# Patient Record
Sex: Female | Born: 1960 | Race: White | Hispanic: No | Marital: Married | Smoking: Never smoker
Health system: Southern US, Community
[De-identification: ages and names within clinical notes are randomized; demographics above are authoritative.]

## PROBLEM LIST (undated history)

## (undated) DIAGNOSIS — T7840XA Allergy, unspecified, initial encounter: Secondary | ICD-10-CM

## (undated) DIAGNOSIS — R42 Dizziness and giddiness: Secondary | ICD-10-CM

## (undated) DIAGNOSIS — B019 Varicella without complication: Secondary | ICD-10-CM

## (undated) DIAGNOSIS — E559 Vitamin D deficiency, unspecified: Secondary | ICD-10-CM

## (undated) DIAGNOSIS — N841 Polyp of cervix uteri: Secondary | ICD-10-CM

## (undated) DIAGNOSIS — G43909 Migraine, unspecified, not intractable, without status migrainosus: Secondary | ICD-10-CM

## (undated) DIAGNOSIS — L821 Other seborrheic keratosis: Secondary | ICD-10-CM

## (undated) DIAGNOSIS — N6009 Solitary cyst of unspecified breast: Secondary | ICD-10-CM

## (undated) HISTORY — DX: Other seborrheic keratosis: L82.1

## (undated) HISTORY — DX: Varicella without complication: B01.9

## (undated) HISTORY — DX: Migraine, unspecified, not intractable, without status migrainosus: G43.909

## (undated) HISTORY — DX: Vitamin D deficiency, unspecified: E55.9

## (undated) HISTORY — PX: ABDOMINAL EXPLORATION SURGERY: SHX538

## (undated) HISTORY — DX: Solitary cyst of unspecified breast: N60.09

## (undated) HISTORY — DX: Allergy, unspecified, initial encounter: T78.40XA

## (undated) HISTORY — DX: Dizziness and giddiness: R42

## (undated) HISTORY — PX: MOHS SURGERY: SHX181

## (undated) HISTORY — DX: Polyp of cervix uteri: N84.1

---

## 2001-12-13 ENCOUNTER — Other Ambulatory Visit: Admission: RE | Admit: 2001-12-13 | Discharge: 2001-12-13 | Payer: Self-pay

## 2003-03-05 ENCOUNTER — Other Ambulatory Visit: Admission: RE | Admit: 2003-03-05 | Discharge: 2003-03-05 | Payer: Self-pay | Admitting: *Deleted

## 2003-05-12 ENCOUNTER — Encounter: Admission: RE | Admit: 2003-05-12 | Discharge: 2003-05-12 | Payer: Self-pay | Admitting: *Deleted

## 2003-05-12 ENCOUNTER — Encounter: Payer: Self-pay | Admitting: *Deleted

## 2004-05-13 ENCOUNTER — Encounter: Admission: RE | Admit: 2004-05-13 | Discharge: 2004-05-13 | Payer: Self-pay | Admitting: *Deleted

## 2004-06-03 ENCOUNTER — Other Ambulatory Visit: Admission: RE | Admit: 2004-06-03 | Discharge: 2004-06-03 | Payer: Self-pay | Admitting: *Deleted

## 2005-06-17 ENCOUNTER — Other Ambulatory Visit: Admission: RE | Admit: 2005-06-17 | Discharge: 2005-06-17 | Payer: Self-pay | Admitting: *Deleted

## 2005-06-20 ENCOUNTER — Encounter: Admission: RE | Admit: 2005-06-20 | Discharge: 2005-06-20 | Payer: Self-pay | Admitting: *Deleted

## 2005-06-30 ENCOUNTER — Encounter: Admission: RE | Admit: 2005-06-30 | Discharge: 2005-06-30 | Payer: Self-pay | Admitting: *Deleted

## 2006-06-16 ENCOUNTER — Other Ambulatory Visit: Admission: RE | Admit: 2006-06-16 | Discharge: 2006-06-16 | Payer: Self-pay | Admitting: Obstetrics & Gynecology

## 2006-06-23 ENCOUNTER — Encounter: Admission: RE | Admit: 2006-06-23 | Discharge: 2006-06-23 | Payer: Self-pay | Admitting: Obstetrics & Gynecology

## 2006-12-15 ENCOUNTER — Encounter: Admission: RE | Admit: 2006-12-15 | Discharge: 2006-12-15 | Payer: Self-pay | Admitting: Obstetrics & Gynecology

## 2007-07-03 ENCOUNTER — Encounter: Admission: RE | Admit: 2007-07-03 | Discharge: 2007-07-03 | Payer: Self-pay | Admitting: Obstetrics & Gynecology

## 2007-08-16 ENCOUNTER — Other Ambulatory Visit: Admission: RE | Admit: 2007-08-16 | Discharge: 2007-08-16 | Payer: Self-pay | Admitting: Obstetrics & Gynecology

## 2008-07-22 ENCOUNTER — Encounter: Admission: RE | Admit: 2008-07-22 | Discharge: 2008-07-22 | Payer: Self-pay | Admitting: Obstetrics & Gynecology

## 2008-08-21 ENCOUNTER — Other Ambulatory Visit: Admission: RE | Admit: 2008-08-21 | Discharge: 2008-08-21 | Payer: Self-pay | Admitting: Obstetrics & Gynecology

## 2009-07-31 ENCOUNTER — Encounter: Admission: RE | Admit: 2009-07-31 | Discharge: 2009-07-31 | Payer: Self-pay | Admitting: Obstetrics & Gynecology

## 2010-08-13 ENCOUNTER — Encounter: Admission: RE | Admit: 2010-08-13 | Discharge: 2010-08-13 | Payer: Self-pay | Admitting: Obstetrics & Gynecology

## 2010-10-31 ENCOUNTER — Encounter: Payer: Self-pay | Admitting: *Deleted

## 2011-07-08 ENCOUNTER — Other Ambulatory Visit: Payer: Self-pay | Admitting: Obstetrics & Gynecology

## 2011-07-08 DIAGNOSIS — Z1231 Encounter for screening mammogram for malignant neoplasm of breast: Secondary | ICD-10-CM

## 2011-08-26 ENCOUNTER — Ambulatory Visit
Admission: RE | Admit: 2011-08-26 | Discharge: 2011-08-26 | Disposition: A | Discharge status: Home / Self Care | Payer: Managed Care, Other (non HMO) | Source: Ambulatory Visit | Attending: Obstetrics & Gynecology | Admitting: Obstetrics & Gynecology

## 2011-08-26 DIAGNOSIS — Z1231 Encounter for screening mammogram for malignant neoplasm of breast: Secondary | ICD-10-CM

## 2011-09-05 ENCOUNTER — Other Ambulatory Visit: Payer: Self-pay | Admitting: Obstetrics & Gynecology

## 2011-09-05 DIAGNOSIS — R928 Other abnormal and inconclusive findings on diagnostic imaging of breast: Secondary | ICD-10-CM

## 2011-09-09 ENCOUNTER — Ambulatory Visit
Admission: RE | Admit: 2011-09-09 | Discharge: 2011-09-09 | Disposition: A | Discharge status: Home / Self Care | Payer: Managed Care, Other (non HMO) | Source: Ambulatory Visit | Attending: Obstetrics & Gynecology | Admitting: Obstetrics & Gynecology

## 2011-09-09 DIAGNOSIS — R928 Other abnormal and inconclusive findings on diagnostic imaging of breast: Secondary | ICD-10-CM

## 2012-04-26 ENCOUNTER — Other Ambulatory Visit: Payer: Self-pay | Admitting: Obstetrics & Gynecology

## 2012-04-26 DIAGNOSIS — N6009 Solitary cyst of unspecified breast: Secondary | ICD-10-CM

## 2012-05-02 ENCOUNTER — Ambulatory Visit
Admission: RE | Admit: 2012-05-02 | Discharge: 2012-05-02 | Disposition: A | Discharge status: Home / Self Care | Payer: Managed Care, Other (non HMO) | Source: Ambulatory Visit | Attending: Obstetrics & Gynecology | Admitting: Obstetrics & Gynecology

## 2012-05-02 ENCOUNTER — Other Ambulatory Visit: Payer: Self-pay | Admitting: Obstetrics & Gynecology

## 2012-05-02 DIAGNOSIS — N6009 Solitary cyst of unspecified breast: Secondary | ICD-10-CM

## 2012-05-02 IMAGING — US US BREAST*L*
1 series · 5 of 5 positions shown · non-contrast
Comparison: [DATE], [DATE] and [DATE] as well as
[DATE]

CLINICAL DATA: Patient presents for a 6-month follow-up diagnostic
left breast mammogram and ultrasound to evaluatea previously seen
probable cyst.

DIGITAL DIAGNOSTIC LEFT MAMMOGRAM WITH CAD AND LEFT BREAST
ULTRASOUND:

[Series 1: us breast*left* · 5 of 5 slices shown]
[im 1/5]
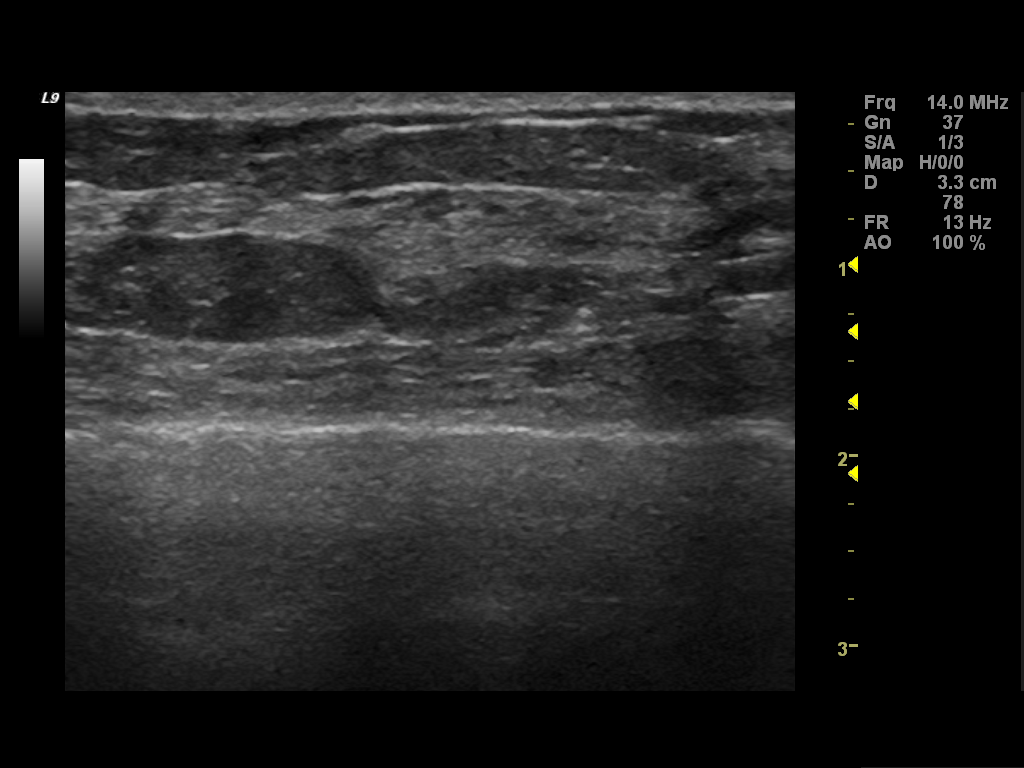
[im 2/5]
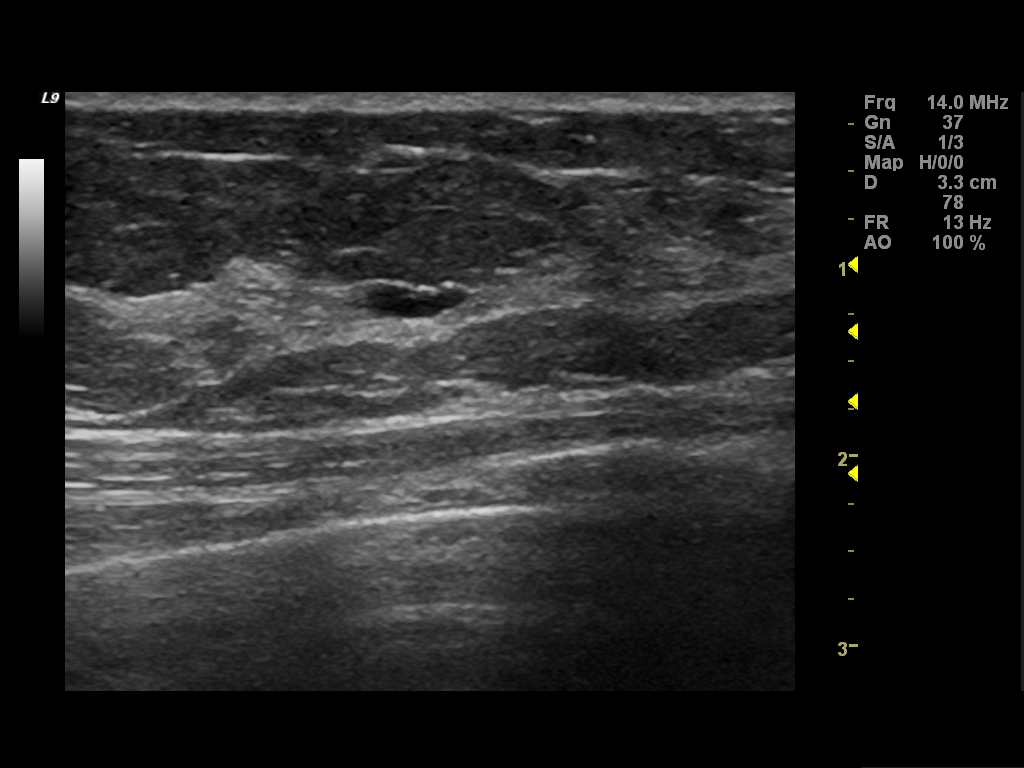
[im 3/5]
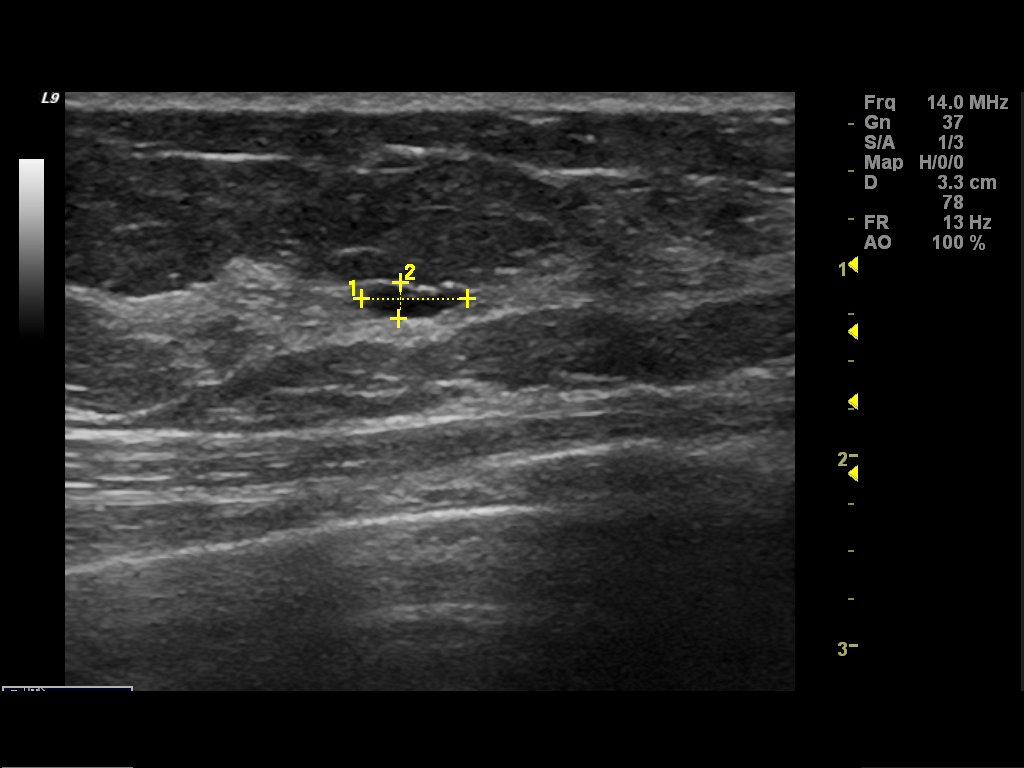
[im 4/5]
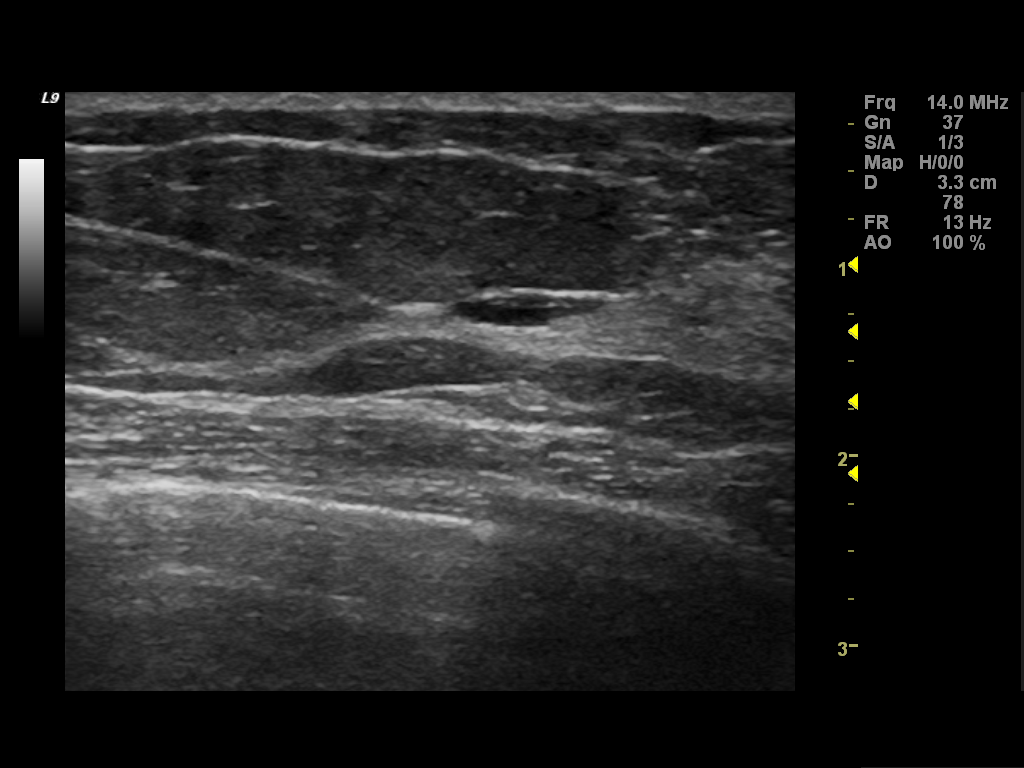
[im 5/5]
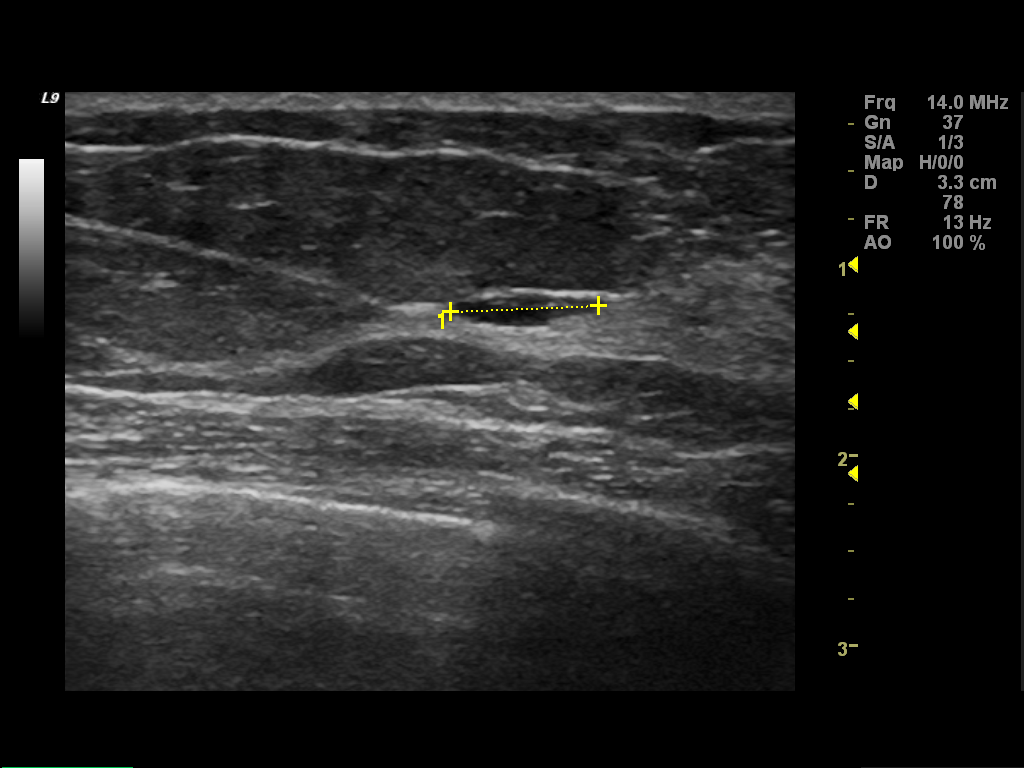

[5 of 5 positions shown; findings below may reference images not displayed]

FINDINGS: Exam demonstrates heterogeneously dense fibroglandular
tissue.  There is  no change in a small nodular density of the
inner left breast.  Remainder of the exam is unchanged.
Mammographic images were processed with CAD.

Ultrasound is performed, showing resolution of the previously noted
5 mm probable cyst at the 6 o'clock position 2 cm from the nipple.
Ultrasound evaluation of the inner half of the left breast
demonstrates a well defined ovoid hypoechoic nodule measuring 2 x 6
x 8 mm likely a benign process such as a complicated cyst. This is
located at the 10 o'clock position 2 cm from the nipple.
IMPRESSION: Resolution of the previously seen 5 mm probable cyst at the 6
o'clock position.  2 x 6 x 8 mm probable  complicated cyst at the
10 o'clock position 2 cm from the nipple.

RECOMMENDATION:
Recommend an additional 6-month follow-up diagnostic left breast
mammogram and ultrasound.

BI-RADS CATEGORY 3:  Probably benign finding(s) - short interval
follow-up suggested.

## 2012-10-10 DIAGNOSIS — R42 Dizziness and giddiness: Secondary | ICD-10-CM

## 2012-10-10 HISTORY — DX: Dizziness and giddiness: R42

## 2012-10-18 ENCOUNTER — Other Ambulatory Visit: Payer: Self-pay | Admitting: Obstetrics & Gynecology

## 2012-10-18 DIAGNOSIS — N6009 Solitary cyst of unspecified breast: Secondary | ICD-10-CM

## 2012-10-31 ENCOUNTER — Emergency Department (HOSPITAL_BASED_OUTPATIENT_CLINIC_OR_DEPARTMENT_OTHER)
Admission: EM | Admit: 2012-10-31 | Discharge: 2012-10-31 | Disposition: A | Discharge status: Home / Self Care | Payer: Managed Care, Other (non HMO) | Attending: Emergency Medicine | Admitting: Emergency Medicine

## 2012-10-31 ENCOUNTER — Encounter (HOSPITAL_BASED_OUTPATIENT_CLINIC_OR_DEPARTMENT_OTHER): Payer: Self-pay | Admitting: Family Medicine

## 2012-10-31 DIAGNOSIS — R42 Dizziness and giddiness: Secondary | ICD-10-CM

## 2012-10-31 DIAGNOSIS — R110 Nausea: Secondary | ICD-10-CM | POA: Insufficient documentation

## 2012-10-31 LAB — COMPREHENSIVE METABOLIC PANEL
Albumin: 4 g/dL (ref 3.5–5.2)
Alkaline Phosphatase: 80 U/L (ref 39–117)
BUN: 18 mg/dL (ref 6–23)
CO2: 23 mEq/L (ref 19–32)
Chloride: 103 mEq/L (ref 96–112)
Creatinine, Ser: 0.6 mg/dL (ref 0.50–1.10)
GFR calc Af Amer: >90 mL/min (ref >90)
GFR calc non Af Amer: >90 mL/min (ref >90)
Glucose, Bld: 112 mg/dL — ABNORMAL HIGH (ref 70–99)
Total Bilirubin: 0.3 mg/dL (ref 0.3–1.2)

## 2012-10-31 LAB — URINALYSIS, ROUTINE W REFLEX MICROSCOPIC
Ketones, ur: NEGATIVE mg/dL
Leukocytes, UA: NEGATIVE
Protein, ur: NEGATIVE mg/dL
Urobilinogen, UA: 0.2 mg/dL (ref 0.0–1.0)

## 2012-10-31 LAB — CBC WITH DIFFERENTIAL/PLATELET
Basophils Absolute: 0 10*3/uL (ref 0.0–0.1)
Basophils Relative: 0 % (ref 0–1)
HCT: 42.4 % (ref 36.0–46.0)
Hemoglobin: 14.6 g/dL (ref 12.0–15.0)
Lymphs Abs: 1.1 10*3/uL (ref 0.7–4.0)
MCV: 88.9 fL (ref 78.0–100.0)
Monocytes Relative: 6 % (ref 3–12)
Neutro Abs: 12.2 10*3/uL — ABNORMAL HIGH (ref 1.7–7.7)
RDW: 13.4 % (ref 11.5–15.5)
WBC: 14.1 10*3/uL — ABNORMAL HIGH (ref 4.0–10.5)

## 2012-10-31 MED ORDER — MECLIZINE HCL 25 MG PO TABS
25.0000 mg | ORAL_TABLET | Freq: Once | ORAL | Status: AC
  Administered 2012-10-31: 25 mg via ORAL
  Filled 2012-10-31: qty 1

## 2012-10-31 MED ORDER — ONDANSETRON 8 MG PO TBDP: 8.0000 mg | ORAL_TABLET | Freq: Once | ORAL | Status: DC

## 2012-10-31 MED ORDER — SODIUM CHLORIDE 0.9 % IV SOLN
INTRAVENOUS | Status: DC
  Administered 2012-10-31: 13:00:00 via INTRAVENOUS

## 2012-10-31 MED ORDER — MECLIZINE HCL 25 MG PO TABS: 25.0000 mg | ORAL_TABLET | Freq: Four times a day (QID) | ORAL | Status: DC

## 2012-10-31 MED ORDER — ONDANSETRON 8 MG PO TBDP
ORAL_TABLET | ORAL | Status: AC
  Administered 2012-10-31: 8 mg
  Filled 2012-10-31: qty 1

## 2012-10-31 NOTE — ED Notes (Signed)
Pt c/o dizziness and nausea x 90 mins.

## 2012-10-31 NOTE — ED Provider Notes (Signed)
History     CSN: [id number]  Arrival date & time 10/31/12  1043   First MD Initiated Contact with Patient 10/31/12 1207      Chief Complaint  Patient presents with  . Dizziness  . Nausea    (Consider location/radiation/quality/duration/timing/severity/associated sxs/prior treatment) HPI Comments: The patient is a 52 year old woman who woke up this morning around 10 AM and felt hot. She says that the room was spinning. Then she had nausea. Her balance is off. She therefore sought evaluation.  Patient is a 52 y.o. female presenting with neurologic complaint. The history is provided by the patient and the spouse. No language interpreter was used.  Neurologic Problem The primary symptoms include dizziness. The symptoms began 1 to 2 hours ago. Episode duration: The feeling of dizziness, spinning is constant. The symptoms are unchanged. Context: No apparent precipitating event.  She describes the dizziness as a sensation of spinning. The dizziness began today. The dizziness has been unchanged since its onset. It is a new problem.  Additional symptoms include loss of balance and nystagmus.    History reviewed. No pertinent past medical history.  History reviewed. No pertinent past surgical history.  No family history on file.  History  Substance Use Topics  . Smoking status: Never Smoker   . Smokeless tobacco: Not on file  . Alcohol Use: Yes    OB History    Grav Para Term Preterm Abortions TAB SAB Ect Mult Living                  Review of Systems  Constitutional: Negative.   HENT: Negative.   Eyes: Negative.   Respiratory: Negative.   Cardiovascular: Negative.   Gastrointestinal: Negative.   Genitourinary: Negative.   Musculoskeletal: Negative.   Skin: Negative.   Neurological: Positive for dizziness and loss of balance.  Psychiatric/Behavioral: Negative.     Allergies  Review of patient's allergies indicates no known allergies.  Home Medications  No current  outpatient prescriptions on file.  BP 108/58  Pulse 60  Temp 97.7 F (36.5 C) (Oral)  Resp 18  SpO2 100%  Physical Exam  Nursing note and vitals reviewed. Constitutional: She is oriented to person, place, and time. She appears well-developed and well-nourished.       In moderate to severe distress with dizziness.  HENT:  Head: Normocephalic and atraumatic.  Right Ear: External ear normal.  Left Ear: External ear normal.  Mouth/Throat: Oropharynx is clear and moist.  Eyes: Conjunctivae normal and EOM are normal. Pupils are equal, round, and reactive to light.       Nystagmus is present to lateral gaze to either side, more to the right.  Neck: Normal range of motion. Neck supple.  Cardiovascular: Normal rate, regular rhythm and normal heart sounds.   Pulmonary/Chest: Effort normal and breath sounds normal.  Abdominal: Soft. Bowel sounds are normal.  Neurological: She is alert and oriented to person, place, and time.       No sensory or motor deficit.  Skin: Skin is warm and dry.  Psychiatric: She has a normal mood and affect. Her behavior is normal.    ED Course  Procedures (including critical care time)  12:28 PM Pt was seen --> physical exam performed.  Antivert 25 mg po ordered.  12:50 PM Pt vomited up the Antivert.  Zofran ODT 8 mg ordered.  IV fluids, lab workup ordered.  1:26 PM  Date: 10/31/2012  Rate: 65  Rhythm: normal sinus rhythm  QRS Axis:  normal  Intervals: normal QRS:  Poor R wave progression in precordial leads suggests old anterior myocardial infarction.  ST/T Wave abnormalities: normal  Conduction Disutrbances:none  Narrative Interpretation: Abnormal EKG.  Old EKG Reviewed: none available  1:48 PM Continues with spinning feeling.  Will retry Antivert 25 mg po.  Waiting for lab results.  2:40 PM Results for orders placed during the hospital encounter of 10/31/12  CBC WITH DIFFERENTIAL      Component Value Range   WBC 14.1 (*) 4.0 - 10.5 K/uL    RBC 4.77  3.87 - 5.11 MIL/uL   Hemoglobin 14.6  12.0 - 15.0 g/dL   HCT 40.9  81.1 - 91.4 %   MCV 88.9  78.0 - 100.0 fL   MCH 30.6  26.0 - 34.0 pg   MCHC 34.4  30.0 - 36.0 g/dL   RDW 78.2  95.6 - 21.3 %   Platelets    150 - 400 K/uL   Value: PLATELET CLUMPS NOTED ON SMEAR, COUNT APPEARS DECREASED   Neutrophils Relative 86 (*) 43 - 77 %   Lymphocytes Relative 8 (*) 12 - 46 %   Monocytes Relative 6  3 - 12 %   Eosinophils Relative 0  0 - 5 %   Basophils Relative 0  0 - 1 %   Neutro Abs 12.2 (*) 1.7 - 7.7 K/uL   Lymphs Abs 1.1  0.7 - 4.0 K/uL   Monocytes Absolute 0.8  0.1 - 1.0 K/uL   Eosinophils Absolute 0.0  0.0 - 0.7 K/uL   Basophils Absolute 0.0  0.0 - 0.1 K/uL   Smear Review       Value: PLATELET CLUMPS NOTED ON SMEAR, COUNT APPEARS DECREASED  COMPREHENSIVE METABOLIC PANEL      Component Value Range   Sodium 136  135 - 145 mEq/L   Potassium 4.0  3.5 - 5.1 mEq/L   Chloride 103  96 - 112 mEq/L   CO2 23  19 - 32 mEq/L   Glucose, Bld 112 (*) 70 - 99 mg/dL   BUN 18  6 - 23 mg/dL   Creatinine, Ser 0.86  0.50 - 1.10 mg/dL   Calcium 9.4  8.4 - 57.8 mg/dL   Total Protein 7.1  6.0 - 8.3 g/dL   Albumin 4.0  3.5 - 5.2 g/dL   AST 26  0 - 37 U/L   ALT 20  0 - 35 U/L   Alkaline Phosphatase 80  39 - 117 U/L   Total Bilirubin 0.3  0.3 - 1.2 mg/dL   GFR calc non Af Amer >90  >90 mL/min   GFR calc Af Amer >90  >90 mL/min  URINALYSIS, ROUTINE W REFLEX MICROSCOPIC      Component Value Range   Color, Urine YELLOW  YELLOW   APPearance CLOUDY (*) CLEAR   Specific Gravity, Urine 1.024  1.005 - 1.030   pH 5.0  5.0 - 8.0   Glucose, UA NEGATIVE  NEGATIVE mg/dL   Hgb urine dipstick NEGATIVE  NEGATIVE   Bilirubin Urine NEGATIVE  NEGATIVE   Ketones, ur NEGATIVE  NEGATIVE mg/dL   Protein, ur NEGATIVE  NEGATIVE mg/dL   Urobilinogen, UA 0.2  0.0 - 1.0 mg/dL   Nitrite NEGATIVE  NEGATIVE   Leukocytes, UA NEGATIVE  NEGATIVE   2:41 PM I reviewed pt's lab results with her and with her  husband.  Dizziness is better, not vomiting.  Wishes to treat this at home.  Rx Antivert 25 mg qid  x 1 week.  Clear liquids today.  No work for one week.    1. Vertigo           Carleene Cooper III, MD 10/31/12 [phone]

## 2012-11-09 ENCOUNTER — Other Ambulatory Visit: Payer: Managed Care, Other (non HMO)

## 2012-11-23 ENCOUNTER — Other Ambulatory Visit: Payer: Managed Care, Other (non HMO)

## 2012-11-30 ENCOUNTER — Other Ambulatory Visit: Payer: Managed Care, Other (non HMO)

## 2012-11-30 ENCOUNTER — Other Ambulatory Visit: Payer: Self-pay | Admitting: Obstetrics & Gynecology

## 2012-12-14 ENCOUNTER — Other Ambulatory Visit: Payer: Managed Care, Other (non HMO)

## 2012-12-28 ENCOUNTER — Ambulatory Visit
Admission: RE | Admit: 2012-12-28 | Discharge: 2012-12-28 | Disposition: A | Discharge status: Home / Self Care | Payer: Managed Care, Other (non HMO) | Source: Ambulatory Visit | Attending: Obstetrics & Gynecology | Admitting: Obstetrics & Gynecology

## 2012-12-28 DIAGNOSIS — N6009 Solitary cyst of unspecified breast: Secondary | ICD-10-CM

## 2013-10-18 ENCOUNTER — Other Ambulatory Visit: Payer: Self-pay

## 2013-10-18 DIAGNOSIS — Z1231 Encounter for screening mammogram for malignant neoplasm of breast: Secondary | ICD-10-CM

## 2013-10-30 ENCOUNTER — Ambulatory Visit: Payer: Self-pay | Admitting: Certified Nurse Midwife

## 2013-11-26 ENCOUNTER — Encounter: Payer: Self-pay | Admitting: Obstetrics & Gynecology

## 2013-11-28 ENCOUNTER — Encounter: Payer: Self-pay | Admitting: Obstetrics & Gynecology

## 2013-11-28 ENCOUNTER — Ambulatory Visit (INDEPENDENT_AMBULATORY_CARE_PROVIDER_SITE_OTHER): Payer: BC Managed Care – PPO | Admitting: Obstetrics & Gynecology

## 2013-11-28 VITALS — BP 106/62 | HR 60 | Resp 12 | Ht 62.25 in | Wt 121.2 lb

## 2013-11-28 DIAGNOSIS — Z0000 Encounter for general adult medical examination without abnormal findings: Secondary | ICD-10-CM

## 2013-11-28 DIAGNOSIS — Z1211 Encounter for screening for malignant neoplasm of colon: Secondary | ICD-10-CM

## 2013-11-28 DIAGNOSIS — Z01419 Encounter for gynecological examination (general) (routine) without abnormal findings: Secondary | ICD-10-CM

## 2013-11-28 LAB — POCT URINALYSIS DIPSTICK
Bilirubin, UA: NEGATIVE
GLUCOSE UA: NEGATIVE
KETONES UA: NEGATIVE
Leukocytes, UA: NEGATIVE
Nitrite, UA: NEGATIVE
Protein, UA: NEGATIVE
RBC UA: NEGATIVE
Urobilinogen, UA: NEGATIVE
pH, UA: 5

## 2013-11-28 LAB — LIPID PANEL
CHOL/HDL RATIO: 3.2 ratio
CHOLESTEROL: 244 mg/dL — AB (ref 0–200)
HDL: 76 mg/dL (ref >39)
LDL Cholesterol: 146 mg/dL — ABNORMAL HIGH (ref 0–99)
TRIGLYCERIDES: 110 mg/dL (ref <150)
VLDL: 22 mg/dL (ref 0–40)

## 2013-11-28 LAB — COMPREHENSIVE METABOLIC PANEL
ALBUMIN: 4.7 g/dL (ref 3.5–5.2)
ALK PHOS: 88 U/L (ref 39–117)
ALT: 15 U/L (ref 0–35)
AST: 21 U/L (ref 0–37)
BILIRUBIN TOTAL: 0.5 mg/dL (ref 0.2–1.2)
BUN: 20 mg/dL (ref 6–23)
CO2: 31 mEq/L (ref 19–32)
CREATININE: 0.75 mg/dL (ref 0.50–1.10)
Calcium: 9.7 mg/dL (ref 8.4–10.5)
Chloride: 99 mEq/L (ref 96–112)
GLUCOSE: 68 mg/dL — AB (ref 70–99)
POTASSIUM: 4 meq/L (ref 3.5–5.3)
Sodium: 139 mEq/L (ref 135–145)
Total Protein: 7.1 g/dL (ref 6.0–8.3)

## 2013-11-28 LAB — CBC WITH DIFFERENTIAL/PLATELET
Basophils Absolute: 0 10*3/uL (ref 0.0–0.1)
Basophils Relative: 1 % (ref 0–1)
EOS ABS: 0.2 10*3/uL (ref 0.0–0.7)
Eosinophils Relative: 4 % (ref 0–5)
HEMATOCRIT: 44.2 % (ref 36.0–46.0)
HEMOGLOBIN: 14.9 g/dL (ref 12.0–15.0)
LYMPHS ABS: 1.1 10*3/uL (ref 0.7–4.0)
Lymphocytes Relative: 29 % (ref 12–46)
MCH: 30.3 pg (ref 26.0–34.0)
MCHC: 33.7 g/dL (ref 30.0–36.0)
MCV: 90 fL (ref 78.0–100.0)
MONO ABS: 0.3 10*3/uL (ref 0.1–1.0)
MONOS PCT: 9 % (ref 3–12)
NEUTROS PCT: 57 % (ref 43–77)
Neutro Abs: 2.2 10*3/uL (ref 1.7–7.7)
Platelets: 166 10*3/uL (ref 150–400)
RBC: 4.91 MIL/uL (ref 3.87–5.11)
RDW: 13.4 % (ref 11.5–15.5)
WBC: 3.8 10*3/uL — ABNORMAL LOW (ref 4.0–10.5)

## 2013-11-28 LAB — HEMOGLOBIN, FINGERSTICK: HEMOGLOBIN, FINGERSTICK: 15.2 g/dL (ref 12.0–16.0)

## 2013-11-28 LAB — TSH: TSH: 1.26 u[IU]/mL (ref 0.350–4.500)

## 2013-11-28 MED ORDER — ESTRADIOL 0.1 MG/GM VA CREA: TOPICAL_CREAM | VAGINAL | Status: DC

## 2013-11-28 NOTE — Patient Instructions (Signed)

## 2013-11-28 NOTE — Progress Notes (Signed)
53 y.o. G2P2 MarriedCaucasianF here for annual exam.  No vaginal bleeding in almost a year.  Patient had some issues with vertigo the past year.  Went to ER.  Had ECG, labs, was treated with anti-emetics.  Sent home.  Had dizziness about a week.  reviewed notes in [facility].  Lots of menopause questions.  Reports biggest issue is urinary urgency.  Getting up at least 2 times a night--this is new over last year.  Would like suggestions.  Patient's last menstrual period was 11/10/2012.          Sexually active: yes  The current method of family planning is none.    Exercising: yes  going to the gym Smoker:  no  Health Maintenance: Pap:  10/19/12 WNL/negative HR HPV History of abnormal Pap:  no MMG:  12/28/12 Bilat diag/left breast (was a 6 month f/u--back to yearly) Colonoscopy:  none BMD:   none TDaP:  11/08  Screening Labs: today, Hb today: 15.2, Urine today: negative   reports that she has never smoked. She has never used smokeless tobacco. She reports that she does not drink alcohol or use illicit drugs.  Past Medical History  Diagnosis Date  . Migraine     without aura  . Seborrheic keratosis   . Cervical polyp   . Vitamin D deficiency   . Breast cyst     left    Past Surgical History  Procedure Laterality Date  . Abdominal exploration surgery      stab x3    Current Outpatient Prescriptions  Medication Sig Dispense Refill  . Calcium Carbonate-Vitamin D (CALCIUM + D PO) Take 600 mg by mouth 2 (two) times daily.       . Multiple Vitamins-Minerals (MULTIVITAMIN PO) Take by mouth daily.      . Vitamin D, Ergocalciferol, (DRISDOL) 50000 UNITS CAPS capsule Take 50,000 Units by mouth every 14 (fourteen) days.       No current facility-administered medications for this visit.    Family History  Problem Relation Age of Onset  . Heart murmur Mother     ROS:  Pertinent items are noted in HPI.  Otherwise, a comprehensive ROS was negative.  Exam:   BP 106/62  Pulse 60  Resp  12  Ht 5' 2.25" (1.581 m)  Wt 121 lb 3.2 oz (54.976 kg)  BMI 21.99 kg/m2  LMP 11/10/2012  Weight change: -2#  Height: 5' 2.25" (158.1 cm)  Ht Readings from Last 3 Encounters:  11/28/13 5' 2.25" (1.581 m)    General appearance: alert, cooperative and appears stated age Head: Normocephalic, without obvious abnormality, atraumatic Neck: no adenopathy, supple, symmetrical, trachea midline and thyroid normal to inspection and palpation Lungs: clear to auscultation bilaterally Breasts: normal appearance, no masses or tenderness Heart: regular rate and rhythm Abdomen: soft, non-tender; bowel sounds normal; no masses,  no organomegaly Extremities: extremities normal, atraumatic, no cyanosis or edema Skin: Skin color, texture, turgor normal. No rashes or lesions Lymph nodes: Cervical, supraclavicular, and axillary nodes normal. No abnormal inguinal nodes palpated Neurologic: Grossly normal   Pelvic: External genitalia:  no lesions              Urethra:  normal appearing urethra with no masses, tenderness or lesions              Bartholins and Skenes: normal                 Vagina: normal appearing vagina with normal color and discharge, no  lesions              Cervix: no lesions              Pap taken: no Bimanual Exam:  Uterus:  normal size, contour, position, consistency, mobility, non-tender              Adnexa: normal adnexa and no mass, fullness, tenderness               Rectovaginal: Confirms               Anus:  normal sphincter tone, no lesions  A:  Well Woman with normal exam PMP, no HRT Urinary urgency H/o small cervical polyp (not seen today) H/O breast cysts, on routine screening.  D/W pt eD Hx of Vit D Def  P:   Mammogram yearly.  3D reocmmended pap smear with neg HR HPV 1/14.  No pap today CBC with Diff, CMP, lipids, Vit D (will need to do RX), and TSH Trial of estrace cream externally to urethra 3 times weekly.  Rx to pharmacy.  D/W pt estrogen risks including  DVT/stroke/PE/breast cancer.  She will give update in 2-3 months and if not significantly better will try oral medication. return annually or prn  An After Visit Summary was printed and given to the patient.

## 2013-11-29 LAB — VITAMIN D 25 HYDROXY (VIT D DEFICIENCY, FRACTURES): VIT D 25 HYDROXY: 93 ng/mL — AB (ref 30–89)

## 2013-12-02 ENCOUNTER — Telehealth: Payer: Self-pay

## 2013-12-02 NOTE — Telephone Encounter (Signed)
Lmtcb//kn 

## 2013-12-02 NOTE — Telephone Encounter (Signed)
Message copied by Robley Fries on Mon Dec 02, 2013 10:22 AM ------      Message from: Megan Salon      Created: Fri Nov 29, 2013 10:31 PM       Inform lipids are elevated at 244.  This is because LDLs are elevated at 146 but HDLs are good, TGs good, and ratio good.  Repeat fasting one year.  CMP nl.  CBC normal.  TSH nl.  Vit D 92.  Pt on 50K every other week.  She needs to stop taking prescription and switch to 1000-2000IU daily.  Can take OTC Vit D. ------

## 2013-12-18 NOTE — Telephone Encounter (Signed)
Patient aware of all lab results

## 2013-12-30 ENCOUNTER — Ambulatory Visit
Admission: RE | Admit: 2013-12-30 | Discharge: 2013-12-30 | Disposition: A | Discharge status: Home / Self Care | Payer: BC Managed Care – PPO | Source: Ambulatory Visit

## 2013-12-30 DIAGNOSIS — Z1231 Encounter for screening mammogram for malignant neoplasm of breast: Secondary | ICD-10-CM

## 2014-08-11 ENCOUNTER — Encounter: Payer: Self-pay | Admitting: Obstetrics & Gynecology

## 2014-12-12 ENCOUNTER — Ambulatory Visit: Payer: BC Managed Care – PPO | Admitting: Obstetrics & Gynecology

## 2014-12-12 ENCOUNTER — Ambulatory Visit (INDEPENDENT_AMBULATORY_CARE_PROVIDER_SITE_OTHER): Payer: BC Managed Care – PPO | Admitting: Obstetrics & Gynecology

## 2014-12-12 ENCOUNTER — Encounter: Payer: Self-pay | Admitting: Obstetrics & Gynecology

## 2014-12-12 VITALS — BP 98/58 | HR 64 | Resp 16 | Ht 62.0 in | Wt 121.4 lb

## 2014-12-12 DIAGNOSIS — Z124 Encounter for screening for malignant neoplasm of cervix: Secondary | ICD-10-CM | POA: Diagnosis not present

## 2014-12-12 DIAGNOSIS — Z01419 Encounter for gynecological examination (general) (routine) without abnormal findings: Secondary | ICD-10-CM

## 2014-12-12 DIAGNOSIS — Z0000 Encounter for general adult medical examination without abnormal findings: Secondary | ICD-10-CM | POA: Diagnosis not present

## 2014-12-12 LAB — POCT URINALYSIS DIPSTICK
BILIRUBIN UA: NEGATIVE
GLUCOSE UA: NEGATIVE
KETONES UA: NEGATIVE
LEUKOCYTES UA: NEGATIVE
Nitrite, UA: NEGATIVE
PH UA: 5
Protein, UA: NEGATIVE
Urobilinogen, UA: NEGATIVE

## 2014-12-12 LAB — LIPID PANEL
CHOL/HDL RATIO: 2.5 ratio
CHOLESTEROL: 214 mg/dL — AB (ref 0–200)
HDL: 84 mg/dL (ref >=46)
LDL CALC: 109 mg/dL — AB (ref 0–99)
Triglycerides: 105 mg/dL (ref <150)
VLDL: 21 mg/dL (ref 0–40)

## 2014-12-12 LAB — HEMOGLOBIN, FINGERSTICK: Hemoglobin, fingerstick: 13.8 g/dL (ref 12.0–16.0)

## 2014-12-12 NOTE — Progress Notes (Signed)
55 y.o. G2P2 MarriedCaucasianF here for annual exam.  Doing well.  No vaginal bleeding.  Pt reports she has had a "family tragedy" that she doesn't want to talk about but this has prevented her from doing her colonoscopy and she knows it is due.  Daughter getting married April 30th and is very busy with all of this.  Patient's last menstrual period was 11/10/2012.          Sexually active: Yes.    The current method of family planning is post menopausal status.    Exercising: Yes.    gym and walking Smoker:  no  Health Maintenance: Pap:  10/19/12 WNL/negative HR HPV History of abnormal Pap:  no MMG:  12/30/13-normal Colonoscopy:  None.   BMD:   none TDaP:  11/08 Screening Labs: lipids and Vit d, Hb today: 13.8, Urine today: RBC-trace   reports that she has never smoked. She has never used smokeless tobacco. She reports that she drinks alcohol. She reports that she does not use illicit drugs.  Past Medical History  Diagnosis Date  . Migraine     without aura  . Seborrheic keratosis   . Cervical polyp   . Vitamin D deficiency   . Breast cyst     left  . Vertigo 2014    Past Surgical History  Procedure Laterality Date  . Abdominal exploration surgery      stab x3    Family History  Problem Relation Age of Onset  . Heart murmur Mother     ROS:  Pertinent items are noted in HPI.  Otherwise, a comprehensive ROS was negative.  Exam:   BP 98/58 mmHg  Pulse 64  Resp 16  Ht 5\' 2"  (1.575 m)  Wt 121 lb 6.4 oz (55.067 kg)  BMI 22.20 kg/m2  LMP 11/10/2012  Height: 5\' 2"  (157.5 cm)  Ht Readings from Last 3 Encounters:  12/12/14 5\' 2"  (1.575 m)  11/28/13 5' 2.25" (1.581 m)    General appearance: alert, cooperative and appears stated age Head: Normocephalic, without obvious abnormality, atraumatic Neck: no adenopathy, supple, symmetrical, trachea midline and thyroid normal to inspection and palpation Lungs: clear to auscultation bilaterally Breasts: normal appearance, no  masses or tenderness Heart: regular rate and rhythm Abdomen: soft, non-tender; bowel sounds normal; no masses,  no organomegaly Extremities: extremities normal, atraumatic, no cyanosis or edema Skin: Skin color, texture, turgor normal. No rashes or lesions Lymph nodes: Cervical, supraclavicular, and axillary nodes normal. No abnormal inguinal nodes palpated Neurologic: Grossly normal   Pelvic: External genitalia:  no lesions              Urethra:  normal appearing urethra with no masses, tenderness or lesions              Bartholins and Skenes: normal                 Vagina: normal appearing vagina with normal color and discharge, no lesions              Cervix: no lesions              Pap taken: Yes.   Bimanual Exam:  Uterus:  normal size, contour, position, consistency, mobility, non-tender              Adnexa: normal adnexa               Rectovaginal: Confirms  Anus:  normal sphincter tone, no lesions  Chaperone was present for exam.  A:  Well Woman with normal exam PMP, no HRT H/O breast cysts and grade 3 breast density Hx of Vit D def  P: Mammogram yearly. 3D recommended. Pap smear with neg HR HPV 1/14. Pap today. Lipids and Vit D today. Pt clearly aware colonoscopy due.  Pt states she WILL do this this year.  Knows to call when ready for referral.   BMD at age 25. return annually or prn

## 2014-12-13 LAB — VITAMIN D 25 HYDROXY (VIT D DEFICIENCY, FRACTURES): VIT D 25 HYDROXY: 54 ng/mL (ref 30–100)

## 2014-12-15 LAB — IPS PAP TEST WITH REFLEX TO HPV

## 2014-12-18 ENCOUNTER — Telehealth: Payer: Self-pay

## 2014-12-18 NOTE — Telephone Encounter (Signed)
Lmtcb//kn 

## 2014-12-18 NOTE — Telephone Encounter (Signed)
-----   Message from Lyman Speller, MD sent at 12/13/2014  7:44 AM EST ----- Inform cholesterol is mildly elevated but this was done non fasting.  TGs and HDLs are good.  Ratio is good.  Repeat 3-5 years.  Vit D was normal.  Continue with [phone] IU OTC Vit D daily.

## 2014-12-22 NOTE — Telephone Encounter (Signed)
Returning call.

## 2014-12-23 ENCOUNTER — Encounter: Payer: Self-pay | Admitting: Obstetrics & Gynecology

## 2014-12-23 NOTE — Telephone Encounter (Signed)
Lmtcb//kn 

## 2014-12-23 NOTE — Telephone Encounter (Signed)
Returning call.

## 2014-12-29 NOTE — Telephone Encounter (Signed)
Lmtcb//kn 

## 2014-12-30 NOTE — Telephone Encounter (Signed)
Patient notified of all results.//kn 

## 2015-02-18 ENCOUNTER — Other Ambulatory Visit: Payer: Self-pay

## 2015-02-18 DIAGNOSIS — Z1231 Encounter for screening mammogram for malignant neoplasm of breast: Secondary | ICD-10-CM

## 2015-02-20 ENCOUNTER — Ambulatory Visit
Admission: RE | Admit: 2015-02-20 | Discharge: 2015-02-20 | Disposition: A | Discharge status: Home / Self Care | Payer: BC Managed Care – PPO | Source: Ambulatory Visit

## 2015-02-20 DIAGNOSIS — Z1231 Encounter for screening mammogram for malignant neoplasm of breast: Secondary | ICD-10-CM

## 2015-12-31 ENCOUNTER — Other Ambulatory Visit: Payer: Self-pay

## 2015-12-31 DIAGNOSIS — Z1231 Encounter for screening mammogram for malignant neoplasm of breast: Secondary | ICD-10-CM

## 2016-02-16 ENCOUNTER — Ambulatory Visit (INDEPENDENT_AMBULATORY_CARE_PROVIDER_SITE_OTHER): Payer: BC Managed Care – PPO | Admitting: Obstetrics & Gynecology

## 2016-02-16 ENCOUNTER — Encounter: Payer: Self-pay | Admitting: Obstetrics & Gynecology

## 2016-02-16 VITALS — BP 100/62 | HR 57 | Resp 16 | Ht 62.5 in | Wt 120.8 lb

## 2016-02-16 DIAGNOSIS — Z01419 Encounter for gynecological examination (general) (routine) without abnormal findings: Secondary | ICD-10-CM | POA: Diagnosis not present

## 2016-02-16 DIAGNOSIS — Z205 Contact with and (suspected) exposure to viral hepatitis: Secondary | ICD-10-CM

## 2016-02-16 DIAGNOSIS — Z1211 Encounter for screening for malignant neoplasm of colon: Secondary | ICD-10-CM | POA: Diagnosis not present

## 2016-02-16 DIAGNOSIS — Z0000 Encounter for general adult medical examination without abnormal findings: Secondary | ICD-10-CM

## 2016-02-16 LAB — POCT URINALYSIS DIPSTICK
Bilirubin, UA: NEGATIVE
Blood, UA: NEGATIVE
GLUCOSE UA: NEGATIVE
Ketones, UA: NEGATIVE
LEUKOCYTES UA: NEGATIVE
NITRITE UA: NEGATIVE
PROTEIN UA: NEGATIVE
UROBILINOGEN UA: NEGATIVE
pH, UA: 6

## 2016-02-16 NOTE — Addendum Note (Signed)
Addended by: Megan Salon on: 02/16/2016 03:48 PM   Modules accepted: Orders, SmartSet

## 2016-02-16 NOTE — Progress Notes (Addendum)
55 y.o. G2P2 MarriedCaucasianF here for annual exam.  Doing well.  No vaginal bleeding.  PCP:  Hubbard Robinson, PA.  [facility] in [place].  Last appt was late spring 2016.  Did blood work then.   No LMP recorded. Patient is not currently having periods (Reason: Perimenopausal).          Sexually active: Yes.    The current method of family planning is post menopausal status.    Exercising: Yes.    Cardio and light weights Smoker:  no  Health Maintenance: Pap:  12/12/2014 negative, neg pap and neg HR HPV 2014. History of abnormal Pap:  no MMG:  02/20/2015 BIRADS 1 negative.  Has appt scheduled for 02/26/16 at [place] Colonoscopy:  None BMD:   none TDaP:  2008 Screening Labs: PCP Hb today: PCP Urine today: pending   reports that she has never smoked. She has never used smokeless tobacco. She reports that she drinks alcohol. She reports that she does not use illicit drugs.  Past Medical History  Diagnosis Date  . Migraine     without aura  . Seborrheic keratosis   . Cervical polyp   . Vitamin D deficiency   . Breast cyst     left  . Vertigo 2014    Past Surgical History  Procedure Laterality Date  . Abdominal exploration surgery      stab x3    Current Outpatient Prescriptions  Medication Sig Dispense Refill  . Calcium Carbonate-Vitamin D (CALCIUM + D PO) Take 600 mg by mouth 2 (two) times daily.     . Multiple Vitamins-Minerals (MULTIVITAMIN PO) Take by mouth daily.     No current facility-administered medications for this visit.    Family History  Problem Relation Age of Onset  . Heart murmur Mother     ROS:  Pertinent items are noted in HPI.  Otherwise, a comprehensive ROS was negative.  Exam:   General appearance: alert, cooperative and appears stated age Head: Normocephalic, without obvious abnormality, atraumatic Neck: no adenopathy, supple, symmetrical, trachea midline and thyroid normal to inspection and palpation Lungs: clear to auscultation  bilaterally Breasts: normal appearance, no masses or tenderness Heart: regular rate and rhythm Abdomen: soft, non-tender; bowel sounds normal; no masses,  no organomegaly Extremities: extremities normal, atraumatic, no cyanosis or edema Skin: Skin color, texture, turgor normal. No rashes or lesions Lymph nodes: Cervical, supraclavicular, and axillary nodes normal. No abnormal inguinal nodes palpated Neurologic: Grossly normal   Pelvic: External genitalia:  no lesions              Urethra:  normal appearing urethra with no masses, tenderness or lesions              Bartholins and Skenes: normal                 Vagina: normal appearing vagina with normal color and discharge, no lesions              Cervix: no lesions              Pap taken: No. Bimanual Exam:  Uterus:  normal size, contour, position, consistency, mobility, non-tender              Adnexa: normal adnexa and no mass, fullness, tenderness               Rectovaginal: Confirms               Anus:  normal sphincter  tone, no lesions  Chaperone was present for exam.  A:  Well Woman with normal exam PMP, no HRT H/O breast cysts and grade 3 breast density Hx of Vit D def  P: Mammogram yearly. 3D recommended. Pap smear with neg HR HPV 1/14. Neg pap 2016.  No pap today Hep C antibody today Pt clearly aware colonoscopy due. Pt states she would do this last year but she didn't.  Pt "promises" to do it this year. Pt will call and schedule.  Labs with PCP will be done later this year.  Pt does not have an appt but declines doing anything else here today Return annually or prn

## 2016-02-16 NOTE — Addendum Note (Signed)
Addended by: Megan Salon on: 02/16/2016 03:50 PM   Modules accepted: Orders, SmartSet

## 2016-02-17 LAB — HEPATITIS C ANTIBODY: HCV Ab: NEGATIVE

## 2016-02-26 ENCOUNTER — Ambulatory Visit
Admission: RE | Admit: 2016-02-26 | Discharge: 2016-02-26 | Disposition: A | Discharge status: Home / Self Care | Payer: BC Managed Care – PPO | Source: Ambulatory Visit

## 2016-02-26 DIAGNOSIS — Z1231 Encounter for screening mammogram for malignant neoplasm of breast: Secondary | ICD-10-CM

## 2017-01-16 ENCOUNTER — Other Ambulatory Visit: Payer: Self-pay | Admitting: Obstetrics & Gynecology

## 2017-01-16 DIAGNOSIS — Z1231 Encounter for screening mammogram for malignant neoplasm of breast: Secondary | ICD-10-CM

## 2017-03-17 ENCOUNTER — Ambulatory Visit
Admission: RE | Admit: 2017-03-17 | Discharge: 2017-03-17 | Disposition: A | Discharge status: Home / Self Care | Payer: BC Managed Care – PPO | Source: Ambulatory Visit | Attending: Obstetrics & Gynecology | Admitting: Obstetrics & Gynecology

## 2017-03-17 DIAGNOSIS — Z1231 Encounter for screening mammogram for malignant neoplasm of breast: Secondary | ICD-10-CM

## 2017-05-19 ENCOUNTER — Ambulatory Visit: Payer: BC Managed Care – PPO | Admitting: Obstetrics & Gynecology

## 2017-06-13 ENCOUNTER — Encounter: Payer: Self-pay | Admitting: Obstetrics & Gynecology

## 2017-06-13 ENCOUNTER — Other Ambulatory Visit (HOSPITAL_COMMUNITY)
Admission: RE | Admit: 2017-06-13 | Discharge: 2017-06-13 | Disposition: A | Discharge status: Home / Self Care | Payer: BC Managed Care – PPO | Source: Ambulatory Visit | Attending: Obstetrics & Gynecology | Admitting: Obstetrics & Gynecology

## 2017-06-13 ENCOUNTER — Ambulatory Visit (INDEPENDENT_AMBULATORY_CARE_PROVIDER_SITE_OTHER): Payer: BC Managed Care – PPO | Admitting: Obstetrics & Gynecology

## 2017-06-13 VITALS — BP 110/74 | HR 76 | Resp 16 | Ht 61.5 in | Wt 119.0 lb

## 2017-06-13 DIAGNOSIS — Z23 Encounter for immunization: Secondary | ICD-10-CM

## 2017-06-13 DIAGNOSIS — R9230 Dense breasts, unspecified: Secondary | ICD-10-CM | POA: Insufficient documentation

## 2017-06-13 DIAGNOSIS — R922 Inconclusive mammogram: Secondary | ICD-10-CM | POA: Diagnosis not present

## 2017-06-13 DIAGNOSIS — Z0000 Encounter for general adult medical examination without abnormal findings: Secondary | ICD-10-CM | POA: Diagnosis not present

## 2017-06-13 DIAGNOSIS — R29890 Loss of height: Secondary | ICD-10-CM | POA: Diagnosis not present

## 2017-06-13 DIAGNOSIS — Z124 Encounter for screening for malignant neoplasm of cervix: Secondary | ICD-10-CM | POA: Diagnosis not present

## 2017-06-13 DIAGNOSIS — Z01419 Encounter for gynecological examination (general) (routine) without abnormal findings: Secondary | ICD-10-CM | POA: Diagnosis not present

## 2017-06-13 DIAGNOSIS — R42 Dizziness and giddiness: Secondary | ICD-10-CM | POA: Insufficient documentation

## 2017-06-13 LAB — POCT URINALYSIS DIPSTICK
Bilirubin, UA: NEGATIVE
Blood, UA: NEGATIVE
GLUCOSE UA: NEGATIVE
Ketones, UA: NEGATIVE
Leukocytes, UA: NEGATIVE
Nitrite, UA: NEGATIVE
Protein, UA: NEGATIVE
UROBILINOGEN UA: 0.2 U/dL
pH, UA: 5 (ref 5.0–8.0)

## 2017-06-13 NOTE — Progress Notes (Signed)
56 y.o. G2P2 MarriedCaucasianF here for annual exam.  Doing well.  No vaginal bleeding.  Having some pain with intercourse.  Would like to discuss options.  Had colonoscopy at [facility].  Terrible experience for her.  Does not have a good understanding of what was done.  Tearful about encounter.  Just not pleased with care.  Feels no questions were really answered to her or husband's satisfaction.  Does not ever want to return there again.  Patient's last menstrual period was 11/21/2012.          Sexually active: Yes.    The current method of family planning is post menopausal status.    Exercising: Yes.    gym, walking Smoker:  no  Health Maintenance: Pap:  12/12/14 Neg   History of abnormal Pap:  no MMG:  03/20/17 BIRADS1:neg  Colonoscopy:  04/2016. Polyps. f/u 1 year? BMD:  none TDaP:  2008 Pneumonia vaccine(s):  No Zostavax:   No Hep C testing: 02/16/16 Neg  Screening Labs: Here Urine today: Negative    reports that she has never smoked. She has never used smokeless tobacco. She reports that she drinks alcohol. She reports that she does not use drugs.  Past Medical History:  Diagnosis Date  . Breast cyst    left  . Cervical polyp   . Migraine    without aura  . Seborrheic keratosis   . Vertigo 2014  . Vitamin D deficiency     Past Surgical History:  Procedure Laterality Date  . ABDOMINAL EXPLORATION SURGERY     stab x3    Current Outpatient Prescriptions  Medication Sig Dispense Refill  . Multiple Vitamins-Minerals (MULTIVITAMIN PO) Take by mouth daily.     No current facility-administered medications for this visit.     Family History  Problem Relation Age of Onset  . Heart murmur Mother   . Breast cancer Neg Hx     ROS:  Pertinent items are noted in HPI.  Otherwise, a comprehensive ROS was negative.  Exam:   BP 110/74 (BP Location: Right Arm, Patient Position: Sitting, Cuff Size: Normal)   Pulse 76   Resp 16   Ht 5' 1.5" (1.562 m)   Wt 119 lb  (54 kg)   LMP 11/21/2012   BMI 22.12 kg/m    Height: 5' 1.5" (156.2 cm)  Ht Readings from Last 3 Encounters:  06/13/17 5' 1.5" (1.562 m)  02/16/16 5' 2.5" (1.588 m)  12/12/14 5\' 2"  (1.575 m)    General appearance: alert, cooperative and appears stated age Head: Normocephalic, without obvious abnormality, atraumatic Neck: no adenopathy, supple, symmetrical, trachea midline and thyroid normal to inspection and palpation Lungs: clear to auscultation bilaterally Breasts: normal appearance, no masses or tenderness Heart: regular rate and rhythm Abdomen: soft, non-tender; bowel sounds normal; no masses,  no organomegaly Extremities: extremities normal, atraumatic, no cyanosis or edema Skin: Skin color, texture, turgor normal. No rashes or lesions Lymph nodes: Cervical, supraclavicular, and axillary nodes normal. No abnormal inguinal nodes palpated Neurologic: Grossly normal   Pelvic: External genitalia:  no lesions              Urethra:  normal appearing urethra with no masses, tenderness or lesions              Bartholins and Skenes: normal                 Vagina: normal appearing vagina with normal color and discharge, no lesions  Cervix: no lesions              Pap taken: Yes.   Bimanual Exam:  Uterus:  normal size, contour, position, consistency, mobility, non-tender              Adnexa: normal adnexa and no mass, fullness, tenderness               Rectovaginal: Confirms               Anus:  normal sphincter tone, no lesions  Chaperone was present for exam.  A:  Well Woman with normal exam PMP, no HRT H/o breast cysts and Grade 3 breast density Vit D deficiency  Heigh loss Painful intercourse  P:   Mammogram guidelines reviewed.  Doing 3D MMG. BMD order placed.  Pt will schedule. Release for colonoscopy and pathology signed today pap smear and HR HPV obtained today Tdap given today CBC, CMP, lipids Options for treatment of dyspareunia discussed.  Pt not  sure she wants to try anything but will consider. return annually or prn

## 2017-06-13 NOTE — Patient Instructions (Signed)
Shingrix -- new shingles vaccines.

## 2017-06-14 ENCOUNTER — Other Ambulatory Visit: Payer: Self-pay | Admitting: Obstetrics & Gynecology

## 2017-06-14 DIAGNOSIS — R29890 Loss of height: Secondary | ICD-10-CM

## 2017-06-14 DIAGNOSIS — E2839 Other primary ovarian failure: Secondary | ICD-10-CM

## 2017-06-14 LAB — CBC
HEMATOCRIT: 42.2 % (ref 34.0–46.6)
HEMOGLOBIN: 14 g/dL (ref 11.1–15.9)
MCH: 31.1 pg (ref 26.6–33.0)
MCHC: 33.2 g/dL (ref 31.5–35.7)
MCV: 94 fL (ref 79–97)
Platelets: 210 10*3/uL (ref 150–379)
RBC: 4.5 x10E6/uL (ref 3.77–5.28)
RDW: 13.8 % (ref 12.3–15.4)
WBC: 5.4 10*3/uL (ref 3.4–10.8)

## 2017-06-14 LAB — LIPID PANEL
CHOL/HDL RATIO: 3.3 ratio (ref 0.0–4.4)
CHOLESTEROL TOTAL: 233 mg/dL — AB (ref 100–199)
HDL: 71 mg/dL (ref >39)
LDL CALC: 133 mg/dL — AB (ref 0–99)
TRIGLYCERIDES: 145 mg/dL (ref 0–149)
VLDL Cholesterol Cal: 29 mg/dL (ref 5–40)

## 2017-06-14 LAB — COMPREHENSIVE METABOLIC PANEL
A/G RATIO: 1.8 (ref 1.2–2.2)
ALBUMIN: 4.4 g/dL (ref 3.5–5.5)
ALK PHOS: 96 IU/L (ref 39–117)
ALT: 17 IU/L (ref 0–32)
AST: 24 IU/L (ref 0–40)
BUN / CREAT RATIO: 29 — AB (ref 9–23)
BUN: 18 mg/dL (ref 6–24)
Bilirubin Total: 0.3 mg/dL (ref 0.0–1.2)
CO2: 28 mmol/L (ref 20–29)
CREATININE: 0.62 mg/dL (ref 0.57–1.00)
Calcium: 9.5 mg/dL (ref 8.7–10.2)
Chloride: 98 mmol/L (ref 96–106)
GFR calc Af Amer: 115 mL/min/{1.73_m2} (ref >59)
GFR calc non Af Amer: 100 mL/min/{1.73_m2} (ref >59)
Globulin, Total: 2.5 g/dL (ref 1.5–4.5)
Glucose: 75 mg/dL (ref 65–99)
POTASSIUM: 3.9 mmol/L (ref 3.5–5.2)
SODIUM: 143 mmol/L (ref 134–144)
Total Protein: 6.9 g/dL (ref 6.0–8.5)

## 2017-06-15 LAB — CYTOLOGY - PAP
DIAGNOSIS: NEGATIVE
HPV (WINDOPATH): NOT DETECTED

## 2017-06-22 ENCOUNTER — Telehealth: Payer: Self-pay

## 2017-06-22 NOTE — Telephone Encounter (Signed)
Called patient at [phone] to discuss lab and pap results. LMOVM to call me back.

## 2017-06-22 NOTE — Telephone Encounter (Signed)
-----   Message from Megan Salon, MD sent at 06/21/2017  1:09 PM EDT ----- Please let pt know her CBC was normal and CMP was normal.  Cholesterol was mildly elvated at 233 and LDLs 133.  Will keep watching this and recheck 1 year.  Please let her know I did get a copy of her colonoscopy.  Everything on the test results say inadequate prep so does need colonoscopy or cologuard in one year.  We will discuss when she comes next year and decide then what is the best option for her.    Also, pap normal and HR HPV negative.  Thanks.

## 2017-06-28 NOTE — Telephone Encounter (Signed)
Spoke with patient, advised of results and recommendations as seen below per Dr. Sabra Heck. Patient verbalizes understanding and is agreeable.  Patient is agreeable to disposition. Will close encounter.

## 2017-06-30 ENCOUNTER — Ambulatory Visit
Admission: RE | Admit: 2017-06-30 | Discharge: 2017-06-30 | Disposition: A | Discharge status: Home / Self Care | Payer: BC Managed Care – PPO | Source: Ambulatory Visit | Attending: Obstetrics & Gynecology | Admitting: Obstetrics & Gynecology

## 2017-06-30 DIAGNOSIS — R29890 Loss of height: Secondary | ICD-10-CM

## 2017-06-30 DIAGNOSIS — E2839 Other primary ovarian failure: Secondary | ICD-10-CM

## 2017-08-15 ENCOUNTER — Telehealth: Payer: Self-pay | Admitting: Obstetrics & Gynecology

## 2017-08-15 NOTE — Telephone Encounter (Signed)
Patient requesting her bone density results

## 2017-08-15 NOTE — Telephone Encounter (Signed)
Spoke with patient. Patient states she received message about BMD results, would like to review them again. Advised as seen below per Dr. Sabra Heck. Patient states she is taking calcium and Vitamin D supplements, participates in regular exercise. Patient has additional questions and concerns reguarding results, recommendations and height loss.  Recommended OV for further discussion with Dr. Sabra Heck. Patient scheduled for 11/16 at 4pm with Dr. Sabra Heck. Patient thankful and verbalizes understanding and is agreeable.   Routing to provider for final review. Patient is agreeable to disposition. Will close encounter.    Notes recorded by Matthew Folks, RN on 07/04/2017 at 8:56 AM EDT Left message with results per DPR. Advised patient to return call with any questions. ------  Notes recorded by Megan Salon, MD on 07/04/2017 at 12:14 AM EDT Please let pt know she has a little osteopenia in her femurs. This does not need medication. Her sine measurements were normal. I would recommend repeating this in 3-4 years. She should continue her multivitamin and make sure it contains some calcium and Vit D. Thanks.

## 2017-08-25 ENCOUNTER — Ambulatory Visit (INDEPENDENT_AMBULATORY_CARE_PROVIDER_SITE_OTHER): Payer: BC Managed Care – PPO | Admitting: Obstetrics & Gynecology

## 2017-08-25 VITALS — BP 100/60 | HR 72 | Resp 14 | Ht 61.5 in | Wt 119.0 lb

## 2017-08-25 DIAGNOSIS — M85852 Other specified disorders of bone density and structure, left thigh: Secondary | ICD-10-CM | POA: Diagnosis not present

## 2017-08-28 ENCOUNTER — Encounter: Payer: Self-pay | Admitting: Obstetrics & Gynecology

## 2017-08-28 DIAGNOSIS — M85852 Other specified disorders of bone density and structure, left thigh: Secondary | ICD-10-CM | POA: Insufficient documentation

## 2017-08-28 NOTE — Progress Notes (Signed)
Subjective:    45 yrs Married Caucasian G2P2  female here to discuss recent BMD obtained 06/30/17 showing osteopenia in his hip with t score -1.6.  Spine measurements were normal.  Result was called to pt but she had a lot of questions and she wasn't completely sure of the results, so asked to come in and review.    Results were reviewed with pt.  Difference between osteopenia and osteoporosis discussed with pt as she was not clear about the differences.  She was concerned that she needed treatment but she is clear now that she does not.     Osteoporosis Risk Factors  Nonmodifiable Personal Hx of fracture as an adult: no Hx of fracture in first-degree relative: no Caucasian race: yes Advanced age: no Female sex: yes Dementia: no Poor health/frailty: no  Potentially modifiable: Tobacco use: no Low body weight (<127 lbs): no Estrogen deficiency  early menopause (age <45) or bilateral ovariectomy: no  prolonged premenopausal amenorrhea (>1 yr): no Low calcium intake (lifelong): no Alcohol use more than 2 drinks per day: no Recurrent falls: no Inadequate physical activity: no  Current calcium and Vit D intake:  1400mg  calcium and 2000 IU Vit D  Review of Systems A comprehensive review of systems was negative.     Objective:   PHYSICAL EXAM BP 100/60 (BP Location: Right Arm, Patient Position: Sitting, Cuff Size: Normal)   Pulse 72   Resp 14   Ht 5' 1.5" (1.562 m)   Wt 119 lb (54 kg)   LMP 11/21/2012   BMI 22.12 kg/m  General appearance: alert, cooperative and no distress  Imaging Bone Density: Spine T Score: 0.1, Hip T Score: -1.6 done on 06/30/17 FRAX score:  10 year probability of hip fracture: 0.7% percent.                        10-year probability of major osteoporotic fractures combined is 7.2% percent.                                         Assessment:   Osteopenia with T score -1.6   Plan:   1.  Patient counseled in adequate calcium and vitamin D  exposure. 2.  Exercise recommended at least 30 minutes 3 times per week.  3.  Recommendation to avoid heavy ETOH use.  4.  Repeat bone density in 2 years.  ~15 minutes spent with patient >50% of time was in face to face discussion of above.

## 2017-10-24 ENCOUNTER — Ambulatory Visit: Payer: BC Managed Care – PPO | Admitting: Family Medicine

## 2017-12-20 ENCOUNTER — Encounter: Payer: Self-pay | Admitting: Family Medicine

## 2017-12-20 ENCOUNTER — Ambulatory Visit: Payer: BC Managed Care – PPO | Admitting: Family Medicine

## 2017-12-20 VITALS — BP 128/64 | HR 62 | Temp 98.1°F | Ht 61.5 in | Wt 118.0 lb

## 2017-12-20 DIAGNOSIS — R6889 Other general symptoms and signs: Secondary | ICD-10-CM | POA: Insufficient documentation

## 2017-12-20 NOTE — Assessment & Plan Note (Signed)
Constellations of symptoms would suggest some flu like illness. Did receive the flu vaccine. Has had exposure to people with similar symptoms - Counseled on supportive care - Given indications to follow-up

## 2017-12-20 NOTE — Progress Notes (Deleted)
  LAYNE LEBON - 57 y.o. female MRN [id number]  Date of birth: June 30, 1961  SUBJECTIVE:  Including CC & ROS.  Chief Complaint  Patient presents with  . Cough    MADALEN GAVIN is a 57 y.o. female that is  presenting with sore thoat and body aches. Symptoms ongoing for three days.  Admits to body aches and fevers.  Denies productive cough. 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 Admits to shortness of breath.  She does get her flu shot. She has been taking Alka seltzer cold medicine with no improvement. She feels her symptoms are not improving.   Review of Systems  HISTORY: Past Medical, Surgical, Social, and Family History Reviewed & Updated per EMR.   Pertinent Historical Findings include:  Past Medical History:  Diagnosis Date  . Breast cyst    left  . Cervical polyp   . Migraine    without aura  . Seborrheic keratosis   . Vertigo 2014  . Vitamin D deficiency     Past Surgical History:  Procedure Laterality Date  . ABDOMINAL EXPLORATION SURGERY     stab x3    Allergies  Allergen Reactions  . Aspirin-Acetaminophen-Caffeine   . Caffeine     Other reaction(s): ANAPHYLAXIS,TACHYCARDIA    Family History  Problem Relation Age of Onset  . Heart murmur Mother   . Breast cancer Neg Hx      Social History   Socioeconomic History  . Marital status: Married    Spouse name: Not on file  . Number of children: Not on file  . Years of education: Not on file  . Highest education level: Not on file  Social Needs  . Financial resource strain: Not on file  . Food insecurity - worry: Not on file  . Food insecurity - inability: Not on file  . Transportation needs - medical: Not on file  . Transportation needs - non-medical: Not on file  Occupational History  . Not on file  Tobacco Use  . Smoking status: Never Smoker  . Smokeless tobacco: Never Used  Substance and Sexual Activity  . Alcohol use: Yes    Alcohol/week: 0.0 oz    Comment:  socially  . Drug use: No  . Sexual activity: Yes    Partners: Male    Birth control/protection: Post-menopausal, Condom  Other Topics Concern  . Not on file  Social History Narrative  . Not on file     PHYSICAL EXAM:  VS: BP 128/64 (BP Location: Left Arm, Patient Position: Sitting, Cuff Size: Normal)   Pulse 62   Temp 98.1 F (36.7 C) (Oral)   Ht 5' 1.5" (1.562 m)   Wt 118 lb (53.5 kg)   SpO2 98%   BMI 21.93 kg/m  Physical Exam Gen: NAD, alert, cooperative with exam, well-appearing ENT: normal lips, normal nasal mucosa,  Eye: normal EOM, normal conjunctiva and lids CV:  no edema, +2 pedal pulses   Resp: no accessory muscle use, non-labored,  GI: no masses or tenderness, no hernia  Skin: no rashes, no areas of induration  Neuro: normal tone, normal sensation to touch Psych:  normal insight, alert and oriented MSK:  ***      ASSESSMENT & PLAN:   No problem-specific Assessment & Plan notes found for this encounter.

## 2017-12-20 NOTE — Patient Instructions (Signed)
Please try things such as zyrtec-D or allegra-D which is an antihistamine and decongestant.   Please try afrin which will help with nasal congestion but use for only three days.   Please also try using a netti pot on a regular occasion.  Honey can help with a sore throat.   Vick's and delsym can help with cough 

## 2017-12-20 NOTE — Progress Notes (Signed)
Colleen Allen - 57 y.o. female MRN [id number]  Date of birth: [birth year]  SUBJECTIVE:  Including CC & ROS.  Chief Complaint  Patient presents with  . Cough    Colleen Allen is a 57 y.o. female that is  presenting with sore thoat and body aches. Symptoms ongoing for three days.  Admits to body aches and fevers.  Denies productive cough. 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 Admits to shortness of breath.  She does get her flu shot. She has been taking Alka seltzer cold medicine with no improvement. She feels her symptoms are not improving.       Review of Systems  Constitutional: Positive for activity change and fever.  HENT: Positive for sore throat.   Respiratory: Positive for cough.   Gastrointestinal: Negative for abdominal pain.  Musculoskeletal: Negative for arthralgias.  Allergic/Immunologic: Negative for immunocompromised state.  Neurological: Negative for weakness.  Hematological: Negative for adenopathy.  Psychiatric/Behavioral: Negative for agitation.    HISTORY: Past Medical, Surgical, Social, and Family History Reviewed & Updated per EMR.   Pertinent Historical Findings include:  Past Medical History:  Diagnosis Date  . Breast cyst    left  . Cervical polyp   . Migraine    without aura  . Seborrheic keratosis   . Vertigo 2014  . Vitamin D deficiency     Past Surgical History:  Procedure Laterality Date  . ABDOMINAL EXPLORATION SURGERY     stab x3    Allergies  Allergen Reactions  . Aspirin-Acetaminophen-Caffeine   . Caffeine     Other reaction(s): ANAPHYLAXIS,TACHYCARDIA    Family History  Problem Relation Age of Onset  . Heart murmur Mother   . Breast cancer Neg Hx      Social History   Socioeconomic History  . Marital status: Married    Spouse name: Not on file  . Number of children: Not on file  . Years of education: Not on file  . Highest education level: Not on file  Social Needs  .  Financial resource strain: Not on file  . Food insecurity - worry: Not on file  . Food insecurity - inability: Not on file  . Transportation needs - medical: Not on file  . Transportation needs - non-medical: Not on file  Occupational History  . Not on file  Tobacco Use  . Smoking status: Never Smoker  . Smokeless tobacco: Never Used  Substance and Sexual Activity  . Alcohol use: Yes    Alcohol/week: 0.0 oz    Comment: socially  . Drug use: No  . Sexual activity: Yes    Partners: Male    Birth control/protection: Post-menopausal, Condom  Other Topics Concern  . Not on file  Social History Narrative  . Not on file     PHYSICAL EXAM:  VS: BP 128/64 (BP Location: Left Arm, Patient Position: Sitting, Cuff Size: Normal)   Pulse 62   Temp 98.1 F (36.7 C) (Oral)   Ht 5' 1.5" (1.562 m)   Wt 118 lb (53.5 kg)   SpO2 98%   BMI 21.93 kg/m  Physical Exam Gen: NAD, alert, cooperative with exam,  ENT: normal lips, normal nasal mucosa, tympanic membranes clear and intact on left, cerumen is occluding the right, normal oropharynx, no cervical lymphadenopathy Eye: normal EOM, normal conjunctiva and lids CV:  no edema, +2 pedal pulses, regular rate and rhythm, S1-S2   Resp: no accessory muscle use, non-labored, clear to  auscultation bilaterally, no crackles or wheezes GI: no masses or tenderness, no hernia  Skin: no rashes, no areas of induration  Neuro: normal tone, normal sensation to touch Psych:  normal insight, alert and oriented MSK: Normal gait, normal strength       ASSESSMENT & PLAN:   No problem-specific Assessment & Plan notes found for this encounter.

## 2017-12-21 ENCOUNTER — Ambulatory Visit: Payer: BC Managed Care – PPO | Admitting: Family Medicine

## 2017-12-29 ENCOUNTER — Encounter: Payer: Self-pay | Admitting: Nurse Practitioner

## 2017-12-29 ENCOUNTER — Encounter: Payer: BC Managed Care – PPO | Admitting: Nurse Practitioner

## 2017-12-29 NOTE — Progress Notes (Signed)
She decided not be seen by me. She wants to establish care with a Physician only. Advised patient that this visit will be canceled and she will need to schedule with Dr. Ethelene Hal or Dr. Zigmund Daniel.

## 2018-02-07 ENCOUNTER — Other Ambulatory Visit: Payer: Self-pay | Admitting: Obstetrics & Gynecology

## 2018-02-07 DIAGNOSIS — Z1231 Encounter for screening mammogram for malignant neoplasm of breast: Secondary | ICD-10-CM

## 2018-03-19 ENCOUNTER — Ambulatory Visit
Admission: RE | Admit: 2018-03-19 | Discharge: 2018-03-19 | Disposition: A | Discharge status: Home / Self Care | Payer: BC Managed Care – PPO | Source: Ambulatory Visit | Attending: Obstetrics & Gynecology | Admitting: Obstetrics & Gynecology

## 2018-03-19 DIAGNOSIS — Z1231 Encounter for screening mammogram for malignant neoplasm of breast: Secondary | ICD-10-CM

## 2018-04-18 ENCOUNTER — Ambulatory Visit: Payer: BC Managed Care – PPO | Admitting: Family Medicine

## 2018-04-18 ENCOUNTER — Encounter: Payer: Self-pay | Admitting: Family Medicine

## 2018-04-18 VITALS — BP 96/58 | HR 71 | Temp 97.8°F | Ht 62.21 in | Wt 117.8 lb

## 2018-04-18 DIAGNOSIS — E785 Hyperlipidemia, unspecified: Secondary | ICD-10-CM | POA: Diagnosis not present

## 2018-04-18 DIAGNOSIS — Z0000 Encounter for general adult medical examination without abnormal findings: Secondary | ICD-10-CM | POA: Insufficient documentation

## 2018-04-18 LAB — COMPREHENSIVE METABOLIC PANEL
ALT: 15 U/L (ref 0–35)
AST: 19 U/L (ref 0–37)
Albumin: 4.2 g/dL (ref 3.5–5.2)
Alkaline Phosphatase: 84 U/L (ref 39–117)
BUN: 21 mg/dL (ref 6–23)
CHLORIDE: 103 meq/L (ref 96–112)
CO2: 33 mEq/L — ABNORMAL HIGH (ref 19–32)
Calcium: 9.6 mg/dL (ref 8.4–10.5)
Creatinine, Ser: 0.7 mg/dL (ref 0.40–1.20)
GFR: 90.88 mL/min (ref >60.00)
GLUCOSE: 93 mg/dL (ref 70–99)
POTASSIUM: 4.3 meq/L (ref 3.5–5.1)
Sodium: 141 mEq/L (ref 135–145)
Total Bilirubin: 0.4 mg/dL (ref 0.2–1.2)
Total Protein: 7.1 g/dL (ref 6.0–8.3)

## 2018-04-18 LAB — CBC
HEMATOCRIT: 42.1 % (ref 36.0–46.0)
HEMOGLOBIN: 14.2 g/dL (ref 12.0–15.0)
MCHC: 33.7 g/dL (ref 30.0–36.0)
MCV: 93.5 fl (ref 78.0–100.0)
Platelets: 173 10*3/uL (ref 150.0–400.0)
RBC: 4.5 Mil/uL (ref 3.87–5.11)
RDW: 13 % (ref 11.5–15.5)
WBC: 3.9 10*3/uL — ABNORMAL LOW (ref 4.0–10.5)

## 2018-04-18 LAB — LIPID PANEL
CHOL/HDL RATIO: 4
Cholesterol: 248 mg/dL — ABNORMAL HIGH (ref 0–200)
HDL: 69.1 mg/dL (ref >39.00)
LDL Cholesterol: 151 mg/dL — ABNORMAL HIGH (ref 0–99)
NonHDL: 178.48
Triglycerides: 136 mg/dL (ref 0.0–149.0)
VLDL: 27.2 mg/dL (ref 0.0–40.0)

## 2018-04-18 LAB — TSH: TSH: 1.57 u[IU]/mL (ref 0.35–4.50)

## 2018-04-18 NOTE — Patient Instructions (Addendum)
Great to meet you.  I will call you with your lab results from today and you can view them online.   

## 2018-04-18 NOTE — Assessment & Plan Note (Signed)
Diet controlled. Repeat cholesterol today.

## 2018-04-18 NOTE — Assessment & Plan Note (Signed)
Reviewed preventive care protocols, scheduled due services, and updated immunizations Discussed nutrition, exercise, diet, and healthy lifestyle.  

## 2018-04-18 NOTE — Progress Notes (Signed)
Subjective:   Patient ID: Colleen Allen, female    DOB: [birth year], 59 y.o.   MRN: [id number]  Colleen Allen is a pleasant 57 y.o. year old female who presents to clinic today with Transfer of Care (Patient is here today to establish care with Dr. Deborra Medina. Please see Allgeries as she is only allergic to Caffiene.  Not allergic to Tylenol or ASA which is in with the allergy list.) and Annual Exam ( She is requesting her CPE today.  Her last PAP was 9.4.18 WNL.  Last Mammogram completed 6.10.19 WNL.  Her immunizations are UTD.  She is currently fasting.  Pt declines HIV screening.  )  on 04/18/2018  HPI:  Here to transfer care and for CPX. Mammogram neg 03/19/18. Last pap was 06/13/17 (normal). Colonoscopy - inadequate prep, polyps found and removed- 04/01/16  Lab Results  Component Value Date   CHOL 233 (H) 06/13/2017   HDL 71 06/13/2017   LDLCALC 133 (H) 06/13/2017   TRIG 145 06/13/2017   CHOLHDL 3.3 06/13/2017   Lab Results  Component Value Date   ALT 17 06/13/2017   AST 24 06/13/2017   ALKPHOS 96 06/13/2017   BILITOT 0.3 06/13/2017   Lab Results  Component Value Date   NA 143 06/13/2017   K 3.9 06/13/2017   CL 98 06/13/2017   CO2 28 06/13/2017   Lab Results  Component Value Date   TSH 1.260 11/28/2013     Health Maintenance  Topic Date Due  . HIV Screening  04/18/2028 (Originally 10/25/1973)  . INFLUENZA VACCINE  05/10/2018  . MAMMOGRAM  03/19/2020  . PAP SMEAR  06/13/2020  . COLONOSCOPY  04/01/2026  . TETANUS/TDAP  06/14/2027  . Hepatitis C Screening  Completed   Current Outpatient Medications on File Prior to Visit  Medication Sig Dispense Refill  . Multiple Vitamins-Minerals (MULTIVITAMIN PO) Take by mouth daily.     No current facility-administered medications on file prior to visit.     Allergies  Allergen Reactions  . Caffeine     Other reaction(s): ANAPHYLAXIS,TACHYCARDIA    Past Medical History:  Diagnosis Date  . Allergy   . Breast cyst    left  . Cervical polyp   . Chicken pox   . Migraine    without aura  . Seborrheic keratosis   . Vertigo 2014  . Vitamin D deficiency     Past Surgical History:  Procedure Laterality Date  . ABDOMINAL EXPLORATION SURGERY     stab x3    Family History  Problem Relation Age of Onset  . Heart murmur Mother   . Hyperlipidemia Mother   . Early death Father   . Cancer Maternal Grandmother        uterine  . Heart disease Maternal Grandfather   . ALS Paternal Grandmother   . Heart disease Paternal Grandfather     Social History   Socioeconomic History  . Marital status: Married    Spouse name: Not on file  . Number of children: Not on file  . Years of education: Not on file  . Highest education level: Not on file  Occupational History  . Not on file  Social Needs  . Financial resource strain: Not on file  . Food insecurity:    Worry: Not on file    Inability: Not on file  . Transportation needs:    Medical: Not on file    Non-medical: Not on file  Tobacco Use  . Smoking  status: Never Smoker  . Smokeless tobacco: Never Used  Substance and Sexual Activity  . Alcohol use: Yes    Alcohol/week: 0.0 oz    Comment: socially  . Drug use: No  . Sexual activity: Yes    Partners: Male    Birth control/protection: Post-menopausal, Condom  Lifestyle  . Physical activity:    Days per week: Not on file    Minutes per session: Not on file  . Stress: Not on file  Relationships  . Social connections:    Talks on phone: Not on file    Gets together: Not on file    Attends religious service: Not on file    Active member of club or organization: Not on file    Attends meetings of clubs or organizations: Not on file    Relationship status: Not on file  . Intimate partner violence:    Fear of current or ex partner: Not on file    Emotionally abused: Not on file    Physically abused: Not on file    Forced sexual activity: Not on file  Other Topics Concern  . Not on file    Social History Narrative  . Not on file   The PMH, PSH, Social History, Family History, Medications, and allergies have been reviewed in [facility], and have been updated if relevant.   Review of Systems  Constitutional: Negative.   HENT: Negative.   Eyes: Negative.   Respiratory: Negative.   Cardiovascular: Negative.   Gastrointestinal: Negative.   Endocrine: Negative.   Genitourinary: Negative.   Musculoskeletal: Negative.   Allergic/Immunologic: Negative.   Neurological: Negative.   Hematological: Negative.   Psychiatric/Behavioral: Negative.   All other systems reviewed and are negative.      Objective:    BP (!) 96/58 (BP Location: Left Arm, Patient Position: Sitting, Cuff Size: Normal)   Pulse 71   Temp 97.8 F (36.6 C) (Oral)   Ht 5' 2.21" (1.58 m)   Wt 117 lb 12.8 oz (53.4 kg)   SpO2 97%   BMI 21.40 kg/m    Physical Exam   General:  Well-developed,well-nourished,in no acute distress; alert,appropriate and cooperative throughout examination Head:  normocephalic and atraumatic.   Eyes:  vision grossly intact, PERRL Ears:  R ear normal and L ear normal externally, TMs clear bilaterally Nose:  no external deformity.   Mouth:  good dentition.   Neck:  No deformities, masses, or tenderness noted. Lungs:  Normal respiratory effort, chest expands symmetrically. Lungs are clear to auscultation, no crackles or wheezes. Heart:  Normal rate and regular rhythm. S1 and S2 normal without gallop, murmur, click, rub or other extra sounds. Abdomen:  Bowel sounds positive,abdomen soft and non-tender without masses, organomegaly or hernias noted. Msk:  No deformity or scoliosis noted of thoracic or lumbar spine.   Extremities:  No clubbing, cyanosis, edema, or deformity noted with normal full range of motion of all joints.   Neurologic:  alert & oriented X3 and gait normal.   Skin:  Intact without suspicious lesions or rashes Cervical Nodes:  No lymphadenopathy noted Axillary  Nodes:  No palpable lymphadenopathy Psych:  Cognition and judgment appear intact. Alert and cooperative with normal attention span and concentration. No apparent delusions, illusions, hallucinations       Assessment & Plan:   Well woman exam without gynecological exam No follow-ups on file.

## 2018-04-19 NOTE — Addendum Note (Signed)
Addended by: Doy Hutching on: 04/19/2018 03:59 PM   Modules accepted: Orders

## 2018-04-24 ENCOUNTER — Telehealth: Payer: Self-pay | Admitting: Family Medicine

## 2018-04-24 NOTE — Telephone Encounter (Signed)
Copied from [place] [id number]. Topic: Inquiry >> Apr 24, 2018  3:24 PM Margot Ables wrote: Reason for CRM: pt called for lab results, does not say [facility] can release, pt did receive VM but isn't sure why lipids need rechecked. Please call to advise.

## 2018-04-25 NOTE — Telephone Encounter (Signed)
Tried calling pt and left a VM with Dr Elonda Husky instructions about low cholesterol diet and rechecking lipids in 3 months. Will try to reach pt again.

## 2018-04-26 NOTE — Telephone Encounter (Signed)
Mailed Cholesterol Lowering paperwork/thx dmf

## 2018-08-24 ENCOUNTER — Telehealth: Payer: Self-pay | Admitting: Obstetrics & Gynecology

## 2018-08-24 ENCOUNTER — Ambulatory Visit: Payer: BC Managed Care – PPO | Admitting: Obstetrics & Gynecology

## 2018-08-24 NOTE — Telephone Encounter (Signed)
Patient cancelled aex today because she has the flu. Will call to reschedule once she's feeling better.

## 2018-08-28 ENCOUNTER — Encounter: Payer: Self-pay | Admitting: Nurse Practitioner

## 2018-08-28 ENCOUNTER — Ambulatory Visit (INDEPENDENT_AMBULATORY_CARE_PROVIDER_SITE_OTHER): Payer: BC Managed Care – PPO

## 2018-08-28 ENCOUNTER — Ambulatory Visit: Payer: BC Managed Care – PPO | Admitting: Nurse Practitioner

## 2018-08-28 VITALS — BP 96/60 | HR 88 | Temp 98.6°F | Ht 62.21 in | Wt 117.4 lb

## 2018-08-28 DIAGNOSIS — R5383 Other fatigue: Secondary | ICD-10-CM

## 2018-08-28 DIAGNOSIS — R5381 Other malaise: Secondary | ICD-10-CM | POA: Diagnosis not present

## 2018-08-28 DIAGNOSIS — J181 Lobar pneumonia, unspecified organism: Secondary | ICD-10-CM

## 2018-08-28 DIAGNOSIS — J209 Acute bronchitis, unspecified: Secondary | ICD-10-CM

## 2018-08-28 DIAGNOSIS — J189 Pneumonia, unspecified organism: Secondary | ICD-10-CM

## 2018-08-28 MED ORDER — DM-GUAIFENESIN ER 30-600 MG PO TB12: 1.0000 | ORAL_TABLET | Freq: Two times a day (BID) | ORAL | 0 refills | Status: DC | PRN

## 2018-08-28 MED ORDER — ALBUTEROL SULFATE (2.5 MG/3ML) 0.083% IN NEBU
2.5000 mg | INHALATION_SOLUTION | Freq: Once | RESPIRATORY_TRACT | Status: AC
  Administered 2018-08-28: 2.5 mg via RESPIRATORY_TRACT

## 2018-08-28 MED ORDER — METHYLPREDNISOLONE ACETATE 40 MG/ML IJ SUSP
40.0000 mg | Freq: Once | INTRAMUSCULAR | Status: AC
  Administered 2018-08-28: 40 mg via INTRAMUSCULAR

## 2018-08-28 MED ORDER — HYDROCODONE-HOMATROPINE 5-1.5 MG/5ML PO SYRP: 5.0000 mL | ORAL_SOLUTION | Freq: Every evening | ORAL | 0 refills | Status: DC | PRN

## 2018-08-28 MED ORDER — AZITHROMYCIN 250 MG PO TABS: 250.0000 mg | ORAL_TABLET | Freq: Every day | ORAL | 0 refills | Status: DC

## 2018-08-28 MED ORDER — ALBUTEROL SULFATE HFA 108 (90 BASE) MCG/ACT IN AERS: 1.0000 | INHALATION_SPRAY | Freq: Four times a day (QID) | RESPIRATORY_TRACT | 0 refills | Status: DC | PRN

## 2018-08-28 NOTE — Patient Instructions (Addendum)
CXR confirms right side pneumonia and bronchitis. F/up with pcp in 3weeks for re eval of symptoms and possible repeat CXR. Work note extended to Monday  Acute Bronchitis, Adult Acute bronchitis is when air tubes (bronchi) in the lungs suddenly get swollen. The condition can make it hard to breathe. It can also cause these symptoms:  A cough.  Coughing up clear, yellow, or green mucus.  Wheezing.  Chest congestion.  Shortness of breath.  A fever.  Body aches.  Chills.  A sore throat.  Follow these instructions at home: Medicines  Take over-the-counter and prescription medicines only as told by your doctor.  If you were prescribed an antibiotic medicine, take it as told by your doctor. Do not stop taking the antibiotic even if you start to feel better. General instructions  Rest.  Drink enough fluids to keep your pee (urine) clear or pale yellow.  Avoid smoking and secondhand smoke. If you smoke and you need help quitting, ask your doctor. Quitting will help your lungs heal faster.  Use an inhaler, cool mist vaporizer, or humidifier as told by your doctor.  Keep all follow-up visits as told by your doctor. This is important. How is this prevented? To lower your risk of getting this condition again:  Wash your hands often with soap and water. If you cannot use soap and water, use hand sanitizer.  Avoid contact with people who have cold symptoms.  Try not to touch your hands to your mouth, nose, or eyes.  Make sure to get the flu shot every year.  Contact a doctor if:  Your symptoms do not get better in 2 weeks. Get help right away if:  You cough up blood.  You have chest pain.  You have very bad shortness of breath.  You become dehydrated.  You faint (pass out) or keep feeling like you are going to pass out.  You keep throwing up (vomiting).  You have a very bad headache.  Your fever or chills gets worse. This information is not intended to  replace advice given to you by your health care provider. Make sure you discuss any questions you have with your health care provider. Document Released: 03/14/2008 Document Revised: 05/04/2016 Document Reviewed: 03/16/2016 Elsevier Interactive Patient Education  [employer].

## 2018-08-28 NOTE — Progress Notes (Signed)
Subjective:  Patient ID: Colleen Allen, female    DOB: 09-25-1961  Age: 57 y.o. MRN: [id number]  CC: Cough (pt is c/o of coughing yellow/green mucus,runny norse,chest discomfort,fatigue. going on 1 wk. tired mucinex. )  Cough  This is a new problem. The current episode started in the past 7 days. The problem has been gradually worsening. The cough is productive of purulent sputum. Associated symptoms include chest pain, chills, nasal congestion, postnasal drip, rhinorrhea, a sore throat and shortness of breath. Pertinent negatives include no wheezing. The symptoms are aggravated by lying down and cold air. Risk factors for lung disease include occupational exposure. She has tried OTC cough suppressant for the symptoms. The treatment provided no relief.   Reviewed past Medical, Social and Family history today.  Outpatient Medications Prior to Visit  Medication Sig Dispense Refill  . Multiple Vitamins-Minerals (MULTIVITAMIN PO) Take by mouth daily.     No facility-administered medications prior to visit.     ROS See HPI  Objective:  BP 96/60   Pulse 88   Temp 98.6 F (37 C) (Oral)   Ht 5' 2.21" (1.58 m)   Wt 117 lb 6.4 oz (53.3 kg)   SpO2 96%   BMI 21.33 kg/m   BP Readings from Last 3 Encounters:  08/28/18 96/60  04/18/18 (!) 96/58  12/29/17 104/60    Wt Readings from Last 3 Encounters:  08/28/18 117 lb 6.4 oz (53.3 kg)  04/18/18 117 lb 12.8 oz (53.4 kg)  12/29/17 118 lb 12.8 oz (53.9 kg)    Physical Exam  Constitutional: She is oriented to person, place, and time. No distress.  HENT:  Nose: Mucosal edema and rhinorrhea present. Right sinus exhibits maxillary sinus tenderness and frontal sinus tenderness. Left sinus exhibits maxillary sinus tenderness and frontal sinus tenderness.  Mouth/Throat: Posterior oropharyngeal erythema present.  Cardiovascular: Normal rate and regular rhythm.  Pulmonary/Chest: Effort normal. She has wheezes. She has no rales.    Lymphadenopathy:    She has cervical adenopathy.  Neurological: She is alert and oriented to person, place, and time.  Vitals reviewed.   Lab Results  Component Value Date   WBC 3.9 (L) 04/18/2018   HGB 14.2 04/18/2018   HCT 42.1 04/18/2018   PLT 173.0 04/18/2018   GLUCOSE 93 04/18/2018   CHOL 248 (H) 04/18/2018   TRIG 136.0 04/18/2018   HDL 69.10 04/18/2018   LDLCALC 151 (H) 04/18/2018   ALT 15 04/18/2018   AST 19 04/18/2018   NA 141 04/18/2018   K 4.3 04/18/2018   CL 103 04/18/2018   CREATININE 0.70 04/18/2018   BUN 21 04/18/2018   CO2 33 (H) 04/18/2018   TSH 1.57 04/18/2018    Mm 3d Screen Breast Bilateral  Result Date: 03/20/2018 CLINICAL DATA:  Screening. EXAM: DIGITAL SCREENING BILATERAL MAMMOGRAM WITH TOMO AND CAD COMPARISON:  Previous exam(s). ACR Breast Density Category c: The breast tissue is heterogeneously dense, which may obscure small masses. FINDINGS: There are no findings suspicious for malignancy. Images were processed with CAD. IMPRESSION: No mammographic evidence of malignancy. A result letter of this screening mammogram will be mailed directly to the patient. RECOMMENDATION: Screening mammogram in one year. (Code:SM-B-01Y) BI-RADS CATEGORY  1: Negative. Electronically Signed   By: Lajean Manes M.D.   On: 03/20/2018 08:10    Assessment & Plan:   Corita was seen today for cough.  Diagnoses and all orders for this visit:  Community acquired pneumonia of right upper lobe of lung ([place]) -  albuterol (PROVENTIL) (2.5 MG/3ML) 0.083% nebulizer solution 2.5 mg -     methylPREDNISolone acetate (DEPO-MEDROL) injection 40 mg -     HYDROcodone-homatropine (HYCODAN) 5-1.5 MG/5ML syrup; Take 5 mLs by mouth at bedtime as needed for cough. -     dextromethorphan-guaiFENesin (MUCINEX DM) 30-600 MG 12hr tablet; Take 1 tablet by mouth 2 (two) times daily as needed for cough. -     azithromycin (ZITHROMAX Z-PAK) 250 MG tablet; Take 1 tablet (250 mg total) by mouth  daily. Take 2tabs on first day, then 1tab once a day till complete -     albuterol (PROVENTIL HFA;VENTOLIN HFA) 108 (90 Base) MCG/ACT inhaler; Inhale 1-2 puffs into the lungs every 6 (six) hours as needed. -     DG Chest 2 View  Acute bronchitis, unspecified organism -     albuterol (PROVENTIL) (2.5 MG/3ML) 0.083% nebulizer solution 2.5 mg -     methylPREDNISolone acetate (DEPO-MEDROL) injection 40 mg -     HYDROcodone-homatropine (HYCODAN) 5-1.5 MG/5ML syrup; Take 5 mLs by mouth at bedtime as needed for cough. -     dextromethorphan-guaiFENesin (MUCINEX DM) 30-600 MG 12hr tablet; Take 1 tablet by mouth 2 (two) times daily as needed for cough. -     azithromycin (ZITHROMAX Z-PAK) 250 MG tablet; Take 1 tablet (250 mg total) by mouth daily. Take 2tabs on first day, then 1tab once a day till complete -     albuterol (PROVENTIL HFA;VENTOLIN HFA) 108 (90 Base) MCG/ACT inhaler; Inhale 1-2 puffs into the lungs every 6 (six) hours as needed. -     DG Chest 2 View   I am having Colleen Allen start on HYDROcodone-homatropine, dextromethorphan-guaiFENesin, azithromycin, and albuterol. I am also having her maintain her Multiple Vitamins-Minerals (MULTIVITAMIN PO). We administered albuterol and methylPREDNISolone acetate.  Meds ordered this encounter  Medications  . albuterol (PROVENTIL) (2.5 MG/3ML) 0.083% nebulizer solution 2.5 mg  . methylPREDNISolone acetate (DEPO-MEDROL) injection 40 mg  . HYDROcodone-homatropine (HYCODAN) 5-1.5 MG/5ML syrup    Sig: Take 5 mLs by mouth at bedtime as needed for cough.    Dispense:  60 mL    Refill:  0    Order Specific Question:   Supervising Provider    Answer:   MATTHEWS, CODY [[id number]]  . dextromethorphan-guaiFENesin (MUCINEX DM) 30-600 MG 12hr tablet    Sig: Take 1 tablet by mouth 2 (two) times daily as needed for cough.    Dispense:  14 tablet    Refill:  0    Order Specific Question:   Supervising Provider    Answer:   MATTHEWS, CODY [[id number]]  .  azithromycin (ZITHROMAX Z-PAK) 250 MG tablet    Sig: Take 1 tablet (250 mg total) by mouth daily. Take 2tabs on first day, then 1tab once a day till complete    Dispense:  6 tablet    Refill:  0    Order Specific Question:   Supervising Provider    Answer:   MATTHEWS, CODY [[id number]]  . albuterol (PROVENTIL HFA;VENTOLIN HFA) 108 (90 Base) MCG/ACT inhaler    Sig: Inhale 1-2 puffs into the lungs every 6 (six) hours as needed.    Dispense:  1 Inhaler    Refill:  0    Order Specific Question:   Supervising Provider    Answer:   MATTHEWS, CODY [[id number]]    Problem List Items Addressed This Visit    None    Visit Diagnoses    Community acquired pneumonia of right upper lobe  of lung ([place])    -  Primary   Relevant Medications   albuterol (PROVENTIL) (2.5 MG/3ML) 0.083% nebulizer solution 2.5 mg (Completed)   methylPREDNISolone acetate (DEPO-MEDROL) injection 40 mg (Completed)   HYDROcodone-homatropine (HYCODAN) 5-1.5 MG/5ML syrup   dextromethorphan-guaiFENesin (MUCINEX DM) 30-600 MG 12hr tablet   azithromycin (ZITHROMAX Z-PAK) 250 MG tablet   albuterol (PROVENTIL HFA;VENTOLIN HFA) 108 (90 Base) MCG/ACT inhaler   Other Relevant Orders   DG Chest 2 View (Completed)   Acute bronchitis, unspecified organism       Relevant Medications   albuterol (PROVENTIL) (2.5 MG/3ML) 0.083% nebulizer solution 2.5 mg (Completed)   methylPREDNISolone acetate (DEPO-MEDROL) injection 40 mg (Completed)   HYDROcodone-homatropine (HYCODAN) 5-1.5 MG/5ML syrup   dextromethorphan-guaiFENesin (MUCINEX DM) 30-600 MG 12hr tablet   azithromycin (ZITHROMAX Z-PAK) 250 MG tablet   albuterol (PROVENTIL HFA;VENTOLIN HFA) 108 (90 Base) MCG/ACT inhaler   Other Relevant Orders   DG Chest 2 View (Completed)       Follow-up: No follow-ups on file.  Wilfred Lacy, NP

## 2018-08-31 ENCOUNTER — Telehealth: Payer: Self-pay | Admitting: Family Medicine

## 2018-08-31 ENCOUNTER — Other Ambulatory Visit: Payer: Self-pay | Admitting: Family Medicine

## 2018-08-31 ENCOUNTER — Telehealth: Payer: Self-pay

## 2018-08-31 MED ORDER — AMOXICILLIN 500 MG PO CAPS: 1000.0000 mg | ORAL_CAPSULE | Freq: Three times a day (TID) | ORAL | 0 refills | Status: DC

## 2018-08-31 NOTE — Telephone Encounter (Signed)
I would recommend addition of amoxicillin 1 gram TID x10 days in addition to azithromycin for coverage of CAP.  If not improving with this she should be seen again early next week or if continuing to worsen over the weekend I would recommend evaluation at urgent care or ED.  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

## 2018-08-31 NOTE — Telephone Encounter (Signed)
Dr. Zigmund Daniel please help in Charlotte's absence.   Pt stated she is not better at all, she has 1 day left of abx but she still has coughing,chest discomfort from coughing,cant sleep,faigue. Pt was wondering if she can get refill of z-pak and out of work note extend until 09/10/18. Advise the pt Baldo Ash and Dr. Deborra Medina is not in the office and offer an appt with another provider today. Pt stated she is so weak (doesn't feel good at all) and wants to see if another provider can help with this without ov.

## 2018-08-31 NOTE — Telephone Encounter (Signed)
Copied from [place] [id number]. Topic: Quick Communication - Rx Refill/Question >> Aug 31, 2018  7:22 AM Keene Breath wrote: Medication: azithromycin (ZITHROMAX Z-PAK) 250 MG tablet  Patient called to request a refill for the above medication  Preferred Pharmacy (with phone number or street name): [facility] [place], [place] [phone] (Phone) [phone] (Fax)

## 2018-08-31 NOTE — Telephone Encounter (Signed)
Pt is aware. appt made for 09/03/18 with Dr. Deborra Medina.

## 2018-08-31 NOTE — Telephone Encounter (Signed)
Copied from [place] [id number]. Topic: General - Other >> Aug 31, 2018 10:08 AM Lennox Solders wrote: Reason for CRM: pt saw charlotte on 08-28-18 and was given a work note to return to work 09-03-18. Pt needs another work note to return after thanksgiving  on 09-10-18. Pt has an appt to see dr Deborra Medina on 09-04-18. pt would like her husband to pick up note today

## 2018-08-31 NOTE — Telephone Encounter (Signed)
Left message for pt. To call back to discuss her request for Z-pack. She was just prescribed this 08/27/18 by her provider.

## 2018-09-03 ENCOUNTER — Encounter: Payer: Self-pay | Admitting: Family Medicine

## 2018-09-03 ENCOUNTER — Ambulatory Visit: Payer: BC Managed Care – PPO | Admitting: Family Medicine

## 2018-09-03 VITALS — BP 126/72 | HR 65 | Temp 98.6°F | Ht 62.2 in | Wt 115.0 lb

## 2018-09-03 DIAGNOSIS — J181 Lobar pneumonia, unspecified organism: Secondary | ICD-10-CM

## 2018-09-03 DIAGNOSIS — J189 Pneumonia, unspecified organism: Secondary | ICD-10-CM | POA: Insufficient documentation
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
## 2018-09-03 NOTE — Assessment & Plan Note (Signed)
Based on Colleen Allen's exam findings in her note verus my exam today, she is clinically improving based on lung sounds.  Finish course of amoxicillin, continue Mucinex DM, advised pushing fluids. She already has follow up scheduled with me in 2 weeks. Advised to keep that appointment. The patient indicates understanding of these issues and agrees with the plan.

## 2018-09-03 NOTE — Patient Instructions (Signed)
Great to see you. Continue muciinex with a lot of water, finish amoxicillin. Keep your appointment with me.  Happy Thanksgiving!  Keep your follow up appointment with me.

## 2018-09-04 ENCOUNTER — Telehealth: Payer: Self-pay | Admitting: Family Medicine

## 2018-09-04 ENCOUNTER — Ambulatory Visit: Payer: BC Managed Care – PPO | Admitting: Family Medicine

## 2018-09-04 NOTE — Telephone Encounter (Signed)
Copied from [place] [id number]. Topic: Quick Communication - See Telephone Encounter >> Sep 04, 2018  9:08 AM Antonieta Iba C wrote: CRM for notification. See Telephone encounter for: 09/04/18.  Pt says that she was prescribed a Z-Pak by her PCP. Pt says that she called in for a refill and was given amoxicillin (AMOXIL) 500 MG capsule instead. Pt says that the amoxicillin (AMOXIL) 500 MG capsule is making her feel high. Pt would like to know if provider could change her back to the Z-Pak.   Pharmacy: [facility] [place], [place] AT [address] (Phone) [phone] (Fax)

## 2018-09-04 NOTE — Telephone Encounter (Signed)
Dr. Zigmund Daniel prescribed the amoxicillin to add to the zpack since she wasn't getting better.  Refilling the zpack would not cover additional bacteria.  I would suggest levaquin ( related to cipro)- has she taken this before and tolerated it well?

## 2018-09-04 NOTE — Telephone Encounter (Addendum)
She has not taken Levaquin prior. She would like to just finish her current regimen. She will notify us if her symptoms are not improving.

## 2018-09-10 ENCOUNTER — Telehealth: Payer: Self-pay | Admitting: Family Medicine

## 2018-09-10 ENCOUNTER — Encounter: Payer: Self-pay | Admitting: Family Medicine

## 2018-09-10 ENCOUNTER — Ambulatory Visit: Payer: BC Managed Care – PPO | Admitting: Family Medicine

## 2018-09-10 DIAGNOSIS — R21 Rash and other nonspecific skin eruption: Secondary | ICD-10-CM | POA: Diagnosis not present

## 2018-09-10 DIAGNOSIS — T7840XA Allergy, unspecified, initial encounter: Secondary | ICD-10-CM | POA: Insufficient documentation

## 2018-09-10 MED ORDER — PREDNISONE 10 MG (21) PO TBPK: ORAL_TABLET | ORAL | 0 refills | Status: DC

## 2018-09-10 MED ORDER — HYDROXYZINE PAMOATE 25 MG PO CAPS: 25.0000 mg | ORAL_CAPSULE | Freq: Three times a day (TID) | ORAL | 0 refills | Status: DC | PRN

## 2018-09-10 NOTE — Patient Instructions (Signed)
Drug Rash A drug rash is a change in the color or texture of the skin that is caused by a drug. It can develop minutes, hours, or days after the person takes the drug. What are the causes? This condition is usually caused by a drug allergy. It can also be caused by exposure to sunlight after taking a drug that makes the skin sensitive to light. Drugs that commonly cause rashes include:  Penicillin.  Antibiotic medicines.  Medicines that treat seizures.  Medicines that treat cancer (chemotherapy).  Aspirin and other nonsteroidal anti-inflammatory drugs (NSAIDs).  Injectable dyes that contain iodine.  Insulin.  What are the signs or symptoms? Symptoms of this condition include:  Redness.  Tiny bumps.  Peeling.  Itching.  Itchy welts (hives).  Swelling.  The rash may appear on a small area of skin or all over the body. How is this diagnosed? To diagnose the condition, your health care provider will do a physical exam. He or she may also order tests to find out which drug caused the rash. Tests to find the cause of a rash include:  Skin tests.  Blood tests.  Drug challenge. For this test, you stop taking all of the drugs that you do not need to take, and then you start taking them again by adding back one of the drugs at a time.  How is this treated? A drug rash may be treated with medicines, including:  Antihistamines. These may be given to relieve itching.  An NSAID. This may be given to reduce swelling and treat pain.  A steroid drug. This may be given to reduce swelling.  The rash usually goes away when the person stops taking the drug that caused it. Follow these instructions at home:  Take medicines only as directed by your health care provider.  Let all of your health care providers know about any drug reactions you have had in the past.  If you have hives, take a cool shower or use a cool compress to relieve itchiness. Contact a health care provider  if:  You have a fever.  Your rash is not going away.  Your rash gets worse.  Your rash comes back.  You have wheezing or coughing. Get help right away if:  You start to have breathing problems.  You start to have shortness of breath.  You face or throat starts to swell.  You have severe weakness with dizziness or fainting.  You have chest pain. This information is not intended to replace advice given to you by your health care provider. Make sure you discuss any questions you have with your health care provider. Document Released: 11/03/2004 Document Revised: 03/03/2016 Document Reviewed: 07/23/2014 Elsevier Interactive Patient Education  2018 Elsevier Inc.  

## 2018-09-10 NOTE — Assessment & Plan Note (Signed)
-  Likely related to amoxicillin, added to allergy list.  -Rx for sterapred taper -Vistaril as needed for itching, cautioned that this may cause sedation.  -F/u for worsening symptoms or if symptoms fail to resolve with Rx treatment.

## 2018-09-10 NOTE — Telephone Encounter (Signed)
Please STOP taking amoxicillin, add to allergy list.  Is she having difficulty breathing?  Please add her to another provider's schedule for today to be evaluated as I am booked.  Thank you.

## 2018-09-10 NOTE — Telephone Encounter (Signed)
Appointment scheduled for today 

## 2018-09-10 NOTE — Telephone Encounter (Signed)
Copied from [place] [id number]. Topic: Quick Communication - See Telephone Encounter >> Sep 10, 2018  7:19 AM Cecelia Byars, NT wrote: CRM for notification. See Telephone encounter for: 09/10/18. Patient called and says she was prescribed amoxacillin,and now has a rash from taking it ,the pharmacist thinks it is related to the the medication .She has 1 more day left  to take the medication, and would like to know what  can be done for the the rash. Please call her at  [phone] , she also  would like to know if it is ok to not take the last dose amoxcillan, she is also taking benadryl for the itching as well .

## 2018-09-10 NOTE — Progress Notes (Signed)
Colleen Allen - 57 y.o. female MRN [id number]  Date of birth: 02-20-1961  Subjective Chief Complaint  Patient presents with  . Rash    Started with feeling itchy-devleoped rash on back has spread throughout her body-two days ago  Has been taking Benadryl. Stopped taking Amoxicillin yesterday-treated for pnuemonia-she has improved.     HPI Colleen Allen is a 57 y.o. female here today for allergic reaction.  She was recently treated for pneumonia with azithromycin and amoxicillin.  Had almost completed course of amoxicillin when she developed rash on trunk and extremities.  Reports that rash is itchy.  She has been using benadryl but itching is not adequately controlled.  She denies shortness of breath, difficulty swallowing, tongue or lip swelling, GI symptoms, or dizziness.    She does report that she is feeling much better from her pneumonia.    ROS:  A comprehensive ROS was completed and negative except as noted per HPI  Allergies  Allergen Reactions  . Caffeine     Other reaction(s): ANAPHYLAXIS,TACHYCARDIA  . Amoxicillin Rash    Past Medical History:  Diagnosis Date  . Allergy   . Breast cyst    left  . Cervical polyp   . Chicken pox   . Migraine    without aura  . Seborrheic keratosis   . Vertigo 2014  . Vitamin D deficiency     Past Surgical History:  Procedure Laterality Date  . ABDOMINAL EXPLORATION SURGERY     stab x3    Social History   Socioeconomic History  . Marital status: Married    Spouse name: Not on file  . Number of children: Not on file  . Years of education: Not on file  . Highest education level: Not on file  Occupational History  . Not on file  Social Needs  . Financial resource strain: Not on file  . Food insecurity:    Worry: Not on file    Inability: Not on file  . Transportation needs:    Medical: Not on file    Non-medical: Not on file  Tobacco Use  . Smoking status: Never Smoker  . Smokeless tobacco: Never Used    Substance and Sexual Activity  . Alcohol use: Yes    Alcohol/week: 0.0 standard drinks    Comment: socially  . Drug use: No  . Sexual activity: Yes    Partners: Male    Birth control/protection: Post-menopausal, Condom  Lifestyle  . Physical activity:    Days per week: Not on file    Minutes per session: Not on file  . Stress: Not on file  Relationships  . Social connections:    Talks on phone: Not on file    Gets together: Not on file    Attends religious service: Not on file    Active member of club or organization: Not on file    Attends meetings of clubs or organizations: Not on file    Relationship status: Not on file  Other Topics Concern  . Not on file  Social History Narrative  . Not on file    Family History  Problem Relation Age of Onset  . Heart murmur Mother   . Hyperlipidemia Mother   . Early death Father   . Cancer Maternal Grandmother        uterine  . Heart disease Maternal Grandfather   . ALS Paternal Grandmother   . Heart disease Paternal Grandfather     Health Maintenance  Topic Date Due  . HIV Screening  04/18/2028 (Originally 10/25/1973)  . MAMMOGRAM  03/19/2020  . PAP SMEAR  06/13/2020  . COLONOSCOPY  04/01/2026  . TETANUS/TDAP  06/14/2027  . INFLUENZA VACCINE  Completed  . Hepatitis C Screening  Completed    ----------------------------------------------------------------------------------------------------------------------------------------------------------------------------------------------------------------- Physical Exam BP 106/64   Pulse 74   Temp 98 F (36.7 C)   Ht 5' 2.2" (1.58 m)   BMI 20.90 kg/m   Physical Exam  Constitutional: She appears well-nourished. No distress.  HENT:  Head: Normocephalic and atraumatic.  Mouth/Throat: Oropharynx is clear and moist.  Eyes: No scleral icterus.  Neck: Neck supple. No thyromegaly present.  Cardiovascular: Normal rate, regular rhythm and normal heart sounds.  Pulmonary/Chest:  Effort normal and breath sounds normal.  Lymphadenopathy:    She has no cervical adenopathy.  Skin: Rash (maculopapular rash on extremities and posterior trunk. ) noted.  Psychiatric: She has a normal mood and affect. Her behavior is normal.    ------------------------------------------------------------------------------------------------------------------------------------------------------------------------------------------------------------------- Assessment and Plan  Allergic drug reaction -Likely related to amoxicillin, added to allergy list.  -Rx for sterapred taper -Vistaril as needed for itching, cautioned that this may cause sedation.  -F/u for worsening symptoms or if symptoms fail to resolve with Rx treatment.

## 2018-09-10 NOTE — Telephone Encounter (Signed)
Left patient a VM to return call.

## 2018-09-17 ENCOUNTER — Ambulatory Visit: Payer: BC Managed Care – PPO | Admitting: Family Medicine

## 2018-11-13 ENCOUNTER — Encounter: Payer: Self-pay | Admitting: Obstetrics & Gynecology

## 2018-11-13 ENCOUNTER — Other Ambulatory Visit: Payer: Self-pay

## 2018-11-13 ENCOUNTER — Encounter

## 2018-11-13 ENCOUNTER — Ambulatory Visit (INDEPENDENT_AMBULATORY_CARE_PROVIDER_SITE_OTHER): Payer: BC Managed Care – PPO | Admitting: Obstetrics & Gynecology

## 2018-11-13 VITALS — BP 100/64 | HR 62 | Resp 14 | Ht 61.5 in | Wt 120.0 lb

## 2018-11-13 DIAGNOSIS — Z1211 Encounter for screening for malignant neoplasm of colon: Secondary | ICD-10-CM | POA: Diagnosis not present

## 2018-11-13 DIAGNOSIS — Z01419 Encounter for gynecological examination (general) (routine) without abnormal findings: Secondary | ICD-10-CM

## 2018-11-13 NOTE — Progress Notes (Signed)
58 y.o. G2P2 Married White or Caucasian female here for annual exam.  Doing well.  Had pneumonia in November.  Cholesterol was elevated with testing in July with Dr. Deborra Medina.  Has made some dietary changes.  Exercising regularly.    Denies vaginal bleeding.  No LMP recorded. (Menstrual status: Perimenopausal).          Sexually active: Yes.    The current method of family planning is post menopausal status.    Exercising: Yes.    Cardio Smoker:  no  Health Maintenance: Pap:  06/13/17 Pap and HR HPV negative History of abnormal Pap:  no MMG:  03/19/18 BIRADS 1 negative/density c Colonoscopy:  04/2016.  Adenomatous polyp.  Had inadequate prep.  Recommendation was to repeat in one year.  BMD:   9/18, -1.6 TDaP:  06/2017  Pneumonia vaccine(s):  No Shingrix:  D/w pt today.  Hep C testing: 02/16/16 Negative Screening Labs: PCP   reports that she has never smoked. She has never used smokeless tobacco. She reports current alcohol use. She reports that she does not use drugs.  Past Medical History:  Diagnosis Date  . Allergy   . Breast cyst    left  . Cervical polyp   . Chicken pox   . Migraine    without aura  . Seborrheic keratosis   . Vertigo 2014  . Vitamin D deficiency     Past Surgical History:  Procedure Laterality Date  . ABDOMINAL EXPLORATION SURGERY     stab x3    Current Outpatient Medications  Medication Sig Dispense Refill  . albuterol (PROVENTIL HFA;VENTOLIN HFA) 108 (90 Base) MCG/ACT inhaler Inhale 1-2 puffs into the lungs every 6 (six) hours as needed. 1 Inhaler 0  . Multiple Vitamins-Minerals (MULTIVITAMIN PO) Take by mouth daily.     No current facility-administered medications for this visit.     Family History  Problem Relation Age of Onset  . Heart murmur Mother   . Hyperlipidemia Mother   . Early death Father   . Cancer Maternal Grandmother        uterine  . Heart disease Maternal Grandfather   . ALS Paternal Grandmother   . Heart disease Paternal  Grandfather     Review of Systems  Genitourinary: Positive for frequency.       Loss of urin with cough or sneeze     Exam:   BP 100/64   Pulse 62   Resp 14   Ht 5' 1.5" (1.562 m)   Wt 120 lb (54.4 kg)   BMI 22.31 kg/m    Height: 5' 1.5" (156.2 cm)  Ht Readings from Last 3 Encounters:  11/13/18 5' 1.5" (1.562 m)  09/10/18 5' 2.2" (1.58 m)  09/03/18 5' 2.2" (1.58 m)    General appearance: alert, cooperative and appears stated age Head: Normocephalic, without obvious abnormality, atraumatic Neck: no adenopathy, supple, symmetrical, trachea midline and thyroid normal to inspection and palpation Lungs: clear to auscultation bilaterally Breasts: normal appearance, no masses or tenderness Heart: regular rate and rhythm Abdomen: soft, non-tender; bowel sounds normal; no masses,  no organomegaly Extremities: extremities normal, atraumatic, no cyanosis or edema Skin: Skin color, texture, turgor normal. No rashes or lesions Lymph nodes: Cervical, supraclavicular, and axillary nodes normal. No abnormal inguinal nodes palpated Neurologic: Grossly normal   Pelvic: External genitalia:  no lesions              Urethra:  normal appearing urethra with no masses, tenderness or lesions  Bartholins and Skenes: normal                 Vagina: normal appearing vagina with normal color and discharge, no lesions              Cervix: no lesions              Pap taken: No. Bimanual Exam:  Uterus:  normal size, contour, position, consistency, mobility, non-tender              Adnexa: normal adnexa and no mass, fullness, tenderness               Rectovaginal: Confirms               Anus:  normal sphincter tone, no lesions  Chaperone was present for exam.  A:  Well Woman with normal exam PMP, no HRT H/O breast cysts and Grade 3 breast density Vit D deficiency H/o adenomatous polyp with inadequate prep (done at [facility]  Report states she has a hx of polyps but she does  not)  P:   Mammogram guidelines reviewed.  Doing yearly. pap smear with neg HR HPV neg 2018 Lab work done with Dr. Deborra Medina in July Referral to Dr. Collene Mares for repeat colonoscopy Shingrix vaccination discussed today.  She is going to check insurance. return annually or prn

## 2018-11-13 NOTE — Patient Instructions (Signed)
Vit C 500mg 

## 2018-11-20 ENCOUNTER — Ambulatory Visit: Payer: BC Managed Care – PPO | Admitting: Podiatry

## 2018-11-20 VITALS — BP 105/72 | HR 69

## 2018-11-20 DIAGNOSIS — L603 Nail dystrophy: Secondary | ICD-10-CM

## 2018-11-20 DIAGNOSIS — B351 Tinea unguium: Secondary | ICD-10-CM | POA: Diagnosis not present

## 2018-11-22 NOTE — Progress Notes (Signed)
Subjective:   Patient ID: Colleen Allen, female   DOB: 58 y.o.   MRN: [id number]   HPI 58 year old female presents the office today for concerns of discoloration to her left big toenail as well as her right big toenail becoming very thick and discolored.  She states that she had the toenail removed about 3 years ago very thick and she has a "hump" in the middle the nail.  She states it causes some irritation issues.  She denies any recent injury or trauma.  No redness or swelling or any drainage.  Possible.   Review of Systems  All other systems reviewed and are negative.  Past Medical History:  Diagnosis Date  . Allergy   . Breast cyst    left  . Cervical polyp   . Chicken pox   . Migraine    without aura  . Seborrheic keratosis   . Vertigo 2014  . Vitamin D deficiency     Past Surgical History:  Procedure Laterality Date  . ABDOMINAL EXPLORATION SURGERY     stab x3     Current Outpatient Medications:  .  albuterol (PROVENTIL HFA;VENTOLIN HFA) 108 (90 Base) MCG/ACT inhaler, Inhale 1-2 puffs into the lungs every 6 (six) hours as needed., Disp: 1 Inhaler, Rfl: 0 .  Multiple Vitamins-Minerals (MULTIVITAMIN PO), Take by mouth daily., Disp: , Rfl:  .  NON FORMULARY, [facility]  ANTIFUNGAL (NAIL)- #1, Disp: , Rfl:   Allergies  Allergen Reactions  . Caffeine     Other reaction(s): ANAPHYLAXIS,TACHYCARDIA  . Amoxicillin Rash          Objective:  Physical Exam  General: AAO x3, NAD  Dermatological: On the left hallux toenail there is some yellow discoloration to the distal portion of the nail.  On the right hallux toenail significantly hyper dystrophic with yellow/brown discoloration there is loosely underlying nail bed is only attached on the proximal aspect.  I am not able to elicit any tenderness today there is no drainage or pus or any surrounding redness or drainage.  No ascending cellulitis.  No open lesions  Vascular: Dorsalis Pedis artery and Posterior  Tibial artery pedal pulses are 2/4 bilateral with immedate capillary fill time. There is no pain with calf compression, swelling, warmth, erythema.   Neruologic: Grossly intact via light touch bilateral.  Protective threshold with Semmes Wienstein monofilament intact to all pedal sites bilateral.  Musculoskeletal: No gross boney pedal deformities bilateral. No pain, crepitus, or limitation noted with foot and ankle range of motion bilateral. Muscular strength 5/5 in all groups tested bilateral.  Gait: Unassisted, Nonantalgic.       Assessment:   Onychomycosis left hallux with onychodystrophy right hallux     Plan:  -Treatment options discussed including all alternatives, risks, and complications -Etiology of symptoms were discussed -We discussed nail avulsion but she wants to hold off on this.  This can be difficult to treat the right big toenail as this significantly damaged.  I did debride the nail without any complications or bleeding.  I prescribed a compound cream today to include onychomycosis treatment as well as urea cream to see if this will be helpful.  She can apply this to the knee on the left side as well.  Trula Slade DPM

## 2019-01-24 ENCOUNTER — Ambulatory Visit (INDEPENDENT_AMBULATORY_CARE_PROVIDER_SITE_OTHER): Payer: BC Managed Care – PPO | Admitting: Family Medicine

## 2019-01-24 ENCOUNTER — Ambulatory Visit: Payer: Self-pay | Admitting: Family Medicine

## 2019-01-24 ENCOUNTER — Encounter: Payer: Self-pay | Admitting: Family Medicine

## 2019-01-24 VITALS — Temp 96.5°F | Wt 119.0 lb

## 2019-01-24 DIAGNOSIS — J989 Respiratory disorder, unspecified: Secondary | ICD-10-CM | POA: Diagnosis not present

## 2019-01-24 NOTE — Progress Notes (Signed)
Colleen Allen - 58 y.o. female MRN [id number]  Date of birth: 12-28-1960   This visit type was conducted due to national recommendations for restrictions regarding the COVID-19 Pandemic (e.g. social distancing).  This format is felt to be most appropriate for this patient at this time.  All issues noted in this document were discussed and addressed.  No physical exam was performed (except for noted visual exam findings with Video Visits).  I discussed the limitations of evaluation and management by telemedicine and the availability of in person appointments. The patient expressed understanding and agreed to proceed.  I connected with@ on 01/24/19 at  2:30 PM EDT by a video enabled telemedicine application and verified that I am speaking with the correct person using two identifiers.   Patient Location: [place] [address]   Provider location:   Home office  Chief Complaint  Patient presents with  . Cough    dry cough, Onset : today , haven't felt good since yesterday, COVID19 Sx/SY, slight SOB, HX Pneum    HPI  Colleen Allen is a 58 y.o. female who presents via Engineer, civil (consulting) for a telehealth visit today.  She has complaint of cough with mild shortness of breath and tight sensation in chest at times. Symptoms onset was 1 day ago.  She also reports nasal congestion.  She denies any change in smell or taste, she does not have shortness of breath right now, she has not had any fever, headache or body aches.  She does have an albuterol inhaler but has not tried this for any of her symptoms.  She reports having pneumonia last fall with similar but more severe symptoms.  She has not had any known exposure to sick contacts.     ROS:  A comprehensive ROS was completed and negative except as noted per HPI  Past Medical History:  Diagnosis Date  . Allergy   . Breast cyst    left  . Cervical polyp   . Chicken pox   . Migraine    without aura  . Seborrheic  keratosis   . Vertigo 2014  . Vitamin D deficiency     Past Surgical History:  Procedure Laterality Date  . ABDOMINAL EXPLORATION SURGERY     stab x3    Family History  Problem Relation Age of Onset  . Heart murmur Mother   . Hyperlipidemia Mother   . Early death Father   . Uterine cancer Maternal Grandmother           . Heart disease Maternal Grandfather   . ALS Paternal Grandmother   . Heart disease Paternal Grandfather     Social History   Socioeconomic History  . Marital status: Married    Spouse name: Not on file  . Number of children: Not on file  . Years of education: Not on file  . Highest education level: Not on file  Occupational History  . Not on file  Social Needs  . Financial resource strain: Not on file  . Food insecurity:    Worry: Not on file    Inability: Not on file  . Transportation needs:    Medical: Not on file    Non-medical: Not on file  Tobacco Use  . Smoking status: Never Smoker  . Smokeless tobacco: Never Used  Substance and Sexual Activity  . Alcohol use: Yes    Alcohol/week: 0.0 standard drinks    Comment: socially  . Drug use: No  .  Sexual activity: Yes    Partners: Male    Birth control/protection: Post-menopausal, Condom  Lifestyle  . Physical activity:    Days per week: Not on file    Minutes per session: Not on file  . Stress: Not on file  Relationships  . Social connections:    Talks on phone: Not on file    Gets together: Not on file    Attends religious service: Not on file    Active member of club or organization: Not on file    Attends meetings of clubs or organizations: Not on file    Relationship status: Not on file  . Intimate partner violence:    Fear of current or ex partner: Not on file    Emotionally abused: Not on file    Physically abused: Not on file    Forced sexual activity: Not on file  Other Topics Concern  . Not on file  Social History Narrative  . Not on file     Current Outpatient  Medications:  .  albuterol (PROVENTIL HFA;VENTOLIN HFA) 108 (90 Base) MCG/ACT inhaler, Inhale 1-2 puffs into the lungs every 6 (six) hours as needed., Disp: 1 Inhaler, Rfl: 0 .  Multiple Vitamins-Minerals (MULTIVITAMIN PO), Take by mouth daily., Disp: , Rfl:  .  NON FORMULARY, [facility]  ANTIFUNGAL (NAIL)- #1, Disp: , Rfl:   EXAM:  VITALS per patient if applicable: Temp (!) 19.1 F (35.8 C) (Temporal) Comment: 96.5 at 6am  Wt 119 lb (54 kg) Comment: Pt reports from hm  BMI 22.12 kg/m   GENERAL: alert, oriented, appears well and in no acute distress.  Sounds congested.   HEENT: atraumatic, conjunttiva clear, no obvious abnormalities on inspection of external nose and ears  NECK: normal movements of the head and neck  LUNGS: on inspection no signs of respiratory distress, breathing rate appears normal, no obvious gross SOB, gasping or wheezing  CV: no obvious cyanosis  MS: moves all visible extremities without noticeable abnormality  PSYCH/NEURO: pleasant and cooperative, no obvious depression or anxiety, speech and thought processing grossly intact  ASSESSMENT AND PLAN:  Discussed the following assessment and plan:  Respiratory illness -Discussed with her that symptoms are likely viral in nature however given limitation of virtual visit  And history of pneumonia as well as prevalence of COVID-19 I recommend that she seek care at urgent care or emergency room if she develops worsening symptoms.   -Suggested supportive care for now, push fluids and rest.  She will try albuterol inhaler as well.        I discussed the assessment and treatment plan with the patient. The patient was provided an opportunity to ask questions and all were answered. The patient agreed with the plan and demonstrated an understanding of the instructions.   The patient was advised to call back or seek an in-person evaluation if the symptoms worsen or if the condition fails to improve as  anticipated.     Luetta Nutting, DO

## 2019-01-24 NOTE — Telephone Encounter (Signed)
Patient is seeing Dr. Zigmund Daniel at 2:30.

## 2019-01-24 NOTE — Assessment & Plan Note (Signed)
-  Discussed with her that symptoms are likely viral in nature however given limitation of virtual visit  And history of pneumonia as well as prevalence of COVID-19 I recommend that she seek care at urgent care or emergency room if she develops worsening symptoms.   -Suggested supportive care for now, push fluids and rest.  She will try albuterol inhaler as well.

## 2019-01-24 NOTE — Telephone Encounter (Addendum)
Pt called in c/o a dry cough that started this morning and a weird feeling in her chest and feeling very tired.   Had pneumonia back in November.  Had similar symptoms.   No fever.   No travels.   No known exposure to the COVID-19 virus.    See triage notes.     I attempted 3 times to warm transfer her call to the [facility] office for Dr. Deborra Medina however the line was busy and on the third try no one answered.  I let the pt know I would have someone call her back.   I made her aware of doing the video chat due to the COVID-19 pandemic.   She is fine with doing that.   She would prefer to use FaceTime if possible.   She feels more comfortable with doing that on her iPhone.  I sent these notes to Dr. Deborra Medina.  I was able to get in touch with Dr. Hulen Shouts office when I called them later.   I let Elmyra Ricks know about the pt and that I had sent the triage notes to the office.  Reason for Disposition . [1] Continuous (nonstop) coughing interferes with work or school AND [2] no improvement using cough treatment per protocol  Answer Assessment - Initial Assessment Questions 1. ONSET: "When did the cough begin?"      Today I feel really bad.  I have no energy and I started coughing today and my chest feels weird.   I had pneumonia in November.   It just concerns me. 2. SEVERITY: "How bad is the cough today?"      It just started this morning. 3. RESPIRATORY DISTRESS: "Describe your breathing."      My chest feels weird.   My nose is stopped up and I'm so tired. 4. FEVER: "Do you have a fever?" If so, ask: "What is your temperature, how was it measured, and when did it start?"     I don't feel like I have a fever.   96.6 this morning.    5. HEMOPTYSIS: "Are you coughing up any blood?" If so ask: "How much?" (flecks, streaks, tablespoons, etc.)     No  6. TREATMENT: "What have you done so far to treat the cough?" (e.g., meds, fluids, humidifier)     No  I'm taking allergy medicine 7. CARDIAC HISTORY: "Do  you have any history of heart disease?" (e.g., heart attack, congestive heart failure)      No 8. LUNG HISTORY: "Do you have any history of lung disease?"  (e.g., pulmonary embolus, asthma, emphysema)     No 9. PE RISK FACTORS: "Do you have a history of blood clots?" (or: recent major surgery, recent prolonged travel, bedridden)     No 10. OTHER SYMPTOMS: "Do you have any other symptoms? (e.g., runny nose, wheezing, chest pain)       No other symtpoms 11. PREGNANCY: "Is there any chance you are pregnant?" "When was your last menstrual period?"       N/A 12. 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  Protocols used: COUGH - ACUTE NON-PRODUCTIVE-A-AH

## 2019-02-19 ENCOUNTER — Ambulatory Visit: Payer: BC Managed Care – PPO | Admitting: Podiatry

## 2019-03-06 ENCOUNTER — Other Ambulatory Visit: Payer: Self-pay | Admitting: Obstetrics & Gynecology

## 2019-03-06 DIAGNOSIS — Z1231 Encounter for screening mammogram for malignant neoplasm of breast: Secondary | ICD-10-CM

## 2019-04-26 LAB — HM COLONOSCOPY

## 2019-04-29 ENCOUNTER — Ambulatory Visit
Admission: RE | Admit: 2019-04-29 | Discharge: 2019-04-29 | Disposition: A | Discharge status: Home / Self Care | Payer: BC Managed Care – PPO | Source: Ambulatory Visit | Attending: Obstetrics & Gynecology | Admitting: Obstetrics & Gynecology

## 2019-04-29 ENCOUNTER — Other Ambulatory Visit: Payer: Self-pay

## 2019-04-29 DIAGNOSIS — Z1231 Encounter for screening mammogram for malignant neoplasm of breast: Secondary | ICD-10-CM

## 2019-05-01 ENCOUNTER — Encounter: Payer: Self-pay | Admitting: Family Medicine

## 2019-05-01 NOTE — Progress Notes (Signed)
[facility]/thx dmf

## 2019-05-07 ENCOUNTER — Telehealth: Payer: Self-pay | Admitting: Family Medicine

## 2019-05-07 NOTE — Telephone Encounter (Signed)
I called and left message on patient voicemail to call office and reschedule appointment, Dr. Deborra Medina not in office on 05/15/2019.

## 2019-05-08 ENCOUNTER — Ambulatory Visit: Payer: BC Managed Care – PPO | Admitting: Family Medicine

## 2019-05-15 ENCOUNTER — Ambulatory Visit: Payer: BC Managed Care – PPO | Admitting: Family Medicine

## 2019-05-26 NOTE — Patient Instructions (Addendum)
Great to see you! I will call you with your lab and xray results from today and you can view them online.   Please call the breast center at [phone] to schedule your bone density (after 07/01/19).  Call [facility]-  I like a lot of their doctors, including Dr. Derrel Nip, Dr. Ubaldo Glassing and Dr. Martinique.  Address: [address] [place], [place], [place] [address] Phone: [phone]
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
## 2019-05-27 ENCOUNTER — Ambulatory Visit (INDEPENDENT_AMBULATORY_CARE_PROVIDER_SITE_OTHER): Payer: BC Managed Care – PPO

## 2019-05-27 ENCOUNTER — Other Ambulatory Visit: Payer: Self-pay

## 2019-05-27 ENCOUNTER — Encounter: Payer: Self-pay | Admitting: Family Medicine

## 2019-05-27 ENCOUNTER — Ambulatory Visit (INDEPENDENT_AMBULATORY_CARE_PROVIDER_SITE_OTHER): Payer: BC Managed Care – PPO | Admitting: Family Medicine

## 2019-05-27 VITALS — BP 110/64 | HR 72 | Temp 98.7°F | Ht 61.75 in | Wt 124.8 lb

## 2019-05-27 DIAGNOSIS — Z0000 Encounter for general adult medical examination without abnormal findings: Secondary | ICD-10-CM | POA: Diagnosis not present

## 2019-05-27 DIAGNOSIS — E2839 Other primary ovarian failure: Secondary | ICD-10-CM

## 2019-05-27 DIAGNOSIS — J209 Acute bronchitis, unspecified: Secondary | ICD-10-CM

## 2019-05-27 DIAGNOSIS — Z8701 Personal history of pneumonia (recurrent): Secondary | ICD-10-CM

## 2019-05-27 DIAGNOSIS — D229 Melanocytic nevi, unspecified: Secondary | ICD-10-CM | POA: Diagnosis not present

## 2019-05-27 DIAGNOSIS — E785 Hyperlipidemia, unspecified: Secondary | ICD-10-CM | POA: Diagnosis not present

## 2019-05-27 DIAGNOSIS — M85852 Other specified disorders of bone density and structure, left thigh: Secondary | ICD-10-CM | POA: Diagnosis not present

## 2019-05-27 DIAGNOSIS — J189 Pneumonia, unspecified organism: Secondary | ICD-10-CM

## 2019-05-27 DIAGNOSIS — J181 Lobar pneumonia, unspecified organism: Secondary | ICD-10-CM

## 2019-05-27 MED ORDER — ALBUTEROL SULFATE HFA 108 (90 BASE) MCG/ACT IN AERS: 1.0000 | INHALATION_SPRAY | Freq: Four times a day (QID) | RESPIRATORY_TRACT | 2 refills | Status: DC | PRN

## 2019-05-27 NOTE — Assessment & Plan Note (Signed)
Reviewed preventive care protocols, scheduled due services, and updated immunizations Discussed nutrition, exercise, diet, and healthy lifestyle.  

## 2019-05-27 NOTE — Assessment & Plan Note (Signed)
With a lot of sun spots as well- advised making an appt with derm- see AVS.

## 2019-05-27 NOTE — Assessment & Plan Note (Signed)
LDL was high last year but ascvd risk score remained low.  Repeat labs today.  Orders Placed This Encounter  Procedures  . DG Bone Density  . Vitamin D (25 hydroxy)  . Comprehensive metabolic panel  . Lipid panel  . CBC with Differential/Platelet  . TSH    The 10-year ASCVD risk score Mikey Bussing DC Jr., et al., 2013) is: 2.5%   Values used to calculate the score:     Age: 58 years     Sex: Female     Is Non-Hispanic African American: No     Diabetic: No     Tobacco smoker: No     Systolic Blood Pressure: 185 mmHg     Is BP treated: No     HDL Cholesterol: 69.1 mg/dL     Total Cholesterol: 248 mg/dL

## 2019-05-27 NOTE — Assessment & Plan Note (Addendum)
DEXA ordered- pt to schedule appt. She does take calcium and vit D daily.  Will check those labs today as well. She does at least an hour of weight bearing exercise daily.

## 2019-05-27 NOTE — Assessment & Plan Note (Signed)
With intermittent SOB (no DOE).  Pt is asking for follow CXR which is reasonable.  I have ordered this today.

## 2019-05-28 ENCOUNTER — Telehealth: Payer: Self-pay

## 2019-05-28 ENCOUNTER — Ambulatory Visit: Payer: Self-pay

## 2019-05-28 LAB — LIPID PANEL
Cholesterol: 245 mg/dL — ABNORMAL HIGH (ref <200)
HDL: 72 mg/dL (ref >=50)
LDL Cholesterol (Calc): 153 mg/dL (calc) — ABNORMAL HIGH
Non-HDL Cholesterol (Calc): 173 mg/dL (calc) — ABNORMAL HIGH (ref <130)
Total CHOL/HDL Ratio: 3.4 (calc) (ref <5.0)
Triglycerides: 93 mg/dL (ref <150)

## 2019-05-28 LAB — COMPREHENSIVE METABOLIC PANEL
AG Ratio: 1.9 (calc) (ref 1.0–2.5)
ALT: 19 U/L (ref 6–29)
AST: 23 U/L (ref 10–35)
Albumin: 4.4 g/dL (ref 3.6–5.1)
Alkaline phosphatase (APISO): 94 U/L (ref 37–153)
BUN: 22 mg/dL (ref 7–25)
CO2: 30 mmol/L (ref 20–32)
Calcium: 10.2 mg/dL (ref 8.6–10.4)
Chloride: 102 mmol/L (ref 98–110)
Creat: 0.65 mg/dL (ref 0.50–0.99)
Globulin: 2.3 g/dL (calc) (ref 1.9–3.7)
Glucose, Bld: 82 mg/dL (ref 65–99)
Potassium: 4.1 mmol/L (ref 3.5–5.3)
Sodium: 140 mmol/L (ref 135–146)
Total Bilirubin: 0.4 mg/dL (ref 0.2–1.2)
Total Protein: 6.7 g/dL (ref 6.1–8.1)

## 2019-05-28 LAB — CBC WITH DIFFERENTIAL/PLATELET
Absolute Monocytes: 446 cells/uL (ref 200–950)
Basophils Absolute: 39 cells/uL (ref 0–200)
Basophils Relative: 0.8 %
Eosinophils Absolute: 98 cells/uL (ref 15–500)
Eosinophils Relative: 2 %
HCT: 40.6 % (ref 35.0–45.0)
Hemoglobin: 14 g/dL (ref 11.7–15.5)
Lymphs Abs: 1313 cells/uL (ref 850–3900)
MCH: 32.2 pg (ref 27.0–33.0)
MCHC: 34.5 g/dL (ref 32.0–36.0)
MCV: 93.3 fL (ref 80.0–100.0)
MPV: 11 fL (ref 7.5–12.5)
Monocytes Relative: 9.1 %
Neutro Abs: 3004 cells/uL (ref 1500–7800)
Neutrophils Relative %: 61.3 %
Platelets: 164 10*3/uL (ref 140–400)
RBC: 4.35 10*6/uL (ref 3.80–5.10)
RDW: 12.8 % (ref 11.0–15.0)
Total Lymphocyte: 26.8 %
WBC: 4.9 10*3/uL (ref 3.8–10.8)

## 2019-05-28 LAB — TSH: TSH: 1.33 mIU/L (ref 0.40–4.50)

## 2019-05-28 LAB — VITAMIN D 25 HYDROXY (VIT D DEFICIENCY, FRACTURES): Vit D, 25-Hydroxy: 51 ng/mL (ref 30–100)

## 2019-05-28 NOTE — Telephone Encounter (Signed)
Provided CxrR results voiced understanding

## 2019-05-28 NOTE — Telephone Encounter (Signed)
Provided lab results to Patient who voiced understanding.  Patient request ma return phone from Provider , what ever is convient for Dr. Deborra Medina just want to discuss the need for medication  Statin medication.  .  Patient can come in for appointment also.

## 2019-05-28 NOTE — Telephone Encounter (Signed)
PEC-OK to give lab results/if she would like a low cholesterol information sheet please advise me and I will mail it to her/thx dmf

## 2019-05-28 NOTE — Telephone Encounter (Signed)
-----   Message from Lucille Passy, MD sent at 05/28/2019 10:13 AM EDT ----- Please call pt- labs overall look great- thyroid function, vitamin Asami Lambright, liver function, kidney function, and liver function look great.  LDL cholesterol is again high but her risk of having a cardiac event in the next 10 years remains low so we do not need to start a statin.  Cutting back on high cholesterol foods is always good.  Her HDL (good cholesterol) looks great.  The 10-year ASCVD risk score Mikey Bussing DC Brooke Bonito., et al., 2013) is: 2.4%   Values used to calculate the score:     Age: 20 years     Sex: Female     Is Non-Hispanic African American: No     Diabetic: No     Tobacco smoker: No     Systolic Blood Pressure: 829 mmHg     Is BP treated: No     HDL Cholesterol: 72 mg/dL     Total Cholesterol: 245 mg/dL

## 2019-05-28 NOTE — Telephone Encounter (Signed)
PEC-OK to give CXR results as is normal no signs of pneumonia/thx dmf

## 2019-05-28 NOTE — Telephone Encounter (Signed)
-----   Message from Lucille Passy, MD sent at 05/27/2019  6:04 PM EDT ----- Please call pt- her chest xray was normal which is very reassuring.  No signs of previous PNA

## 2019-05-30 NOTE — Telephone Encounter (Signed)
LMOVM that Dr. Deborra Medina does not see the need for a Statin at this point and that I mailed the low-Cholesterol paperwork to her/thx dmf

## 2019-05-31 ENCOUNTER — Ambulatory Visit: Payer: BC Managed Care – PPO | Admitting: Obstetrics & Gynecology

## 2019-09-13 ENCOUNTER — Other Ambulatory Visit: Payer: BC Managed Care – PPO

## 2019-11-05 ENCOUNTER — Telehealth: Payer: Self-pay | Admitting: Family Medicine

## 2019-11-05 NOTE — Telephone Encounter (Signed)
Pt called because she received a letter about Dr Deborra Medina leaving and saw on there that she would make a recommendation on who to see so she wanted me to ask you who you would like her to see

## 2019-11-06 NOTE — Telephone Encounter (Signed)
I do my best to answer these questions but I am really just telling people that if they want to stay at [facility], Dr. Myrtie Cruise ,Abby Potash, Dr. Ethelene Hal are great.  If they want to try another location, those numbers are listed on the letter they received.  We have great doctors at all locations, ones I trust my own family to.

## 2019-11-06 NOTE — Telephone Encounter (Signed)
No answer

## 2019-11-15 ENCOUNTER — Other Ambulatory Visit: Payer: Self-pay

## 2019-11-19 ENCOUNTER — Encounter: Payer: Self-pay | Admitting: Obstetrics & Gynecology

## 2019-11-19 ENCOUNTER — Ambulatory Visit: Payer: BC Managed Care – PPO | Admitting: Obstetrics & Gynecology
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
## 2019-11-21 ENCOUNTER — Encounter: Payer: Self-pay | Admitting: Obstetrics & Gynecology

## 2019-12-06 ENCOUNTER — Other Ambulatory Visit: Payer: BC Managed Care – PPO

## 2019-12-30 ENCOUNTER — Encounter: Payer: Self-pay | Admitting: Obstetrics & Gynecology

## 2019-12-30 ENCOUNTER — Ambulatory Visit: Payer: BC Managed Care – PPO | Admitting: Obstetrics & Gynecology

## 2019-12-30 ENCOUNTER — Other Ambulatory Visit: Payer: Self-pay

## 2019-12-30 VITALS — BP 110/60 | HR 76 | Temp 98.1°F | Resp 10 | Ht 61.5 in | Wt 123.8 lb

## 2019-12-30 DIAGNOSIS — Z01419 Encounter for gynecological examination (general) (routine) without abnormal findings: Secondary | ICD-10-CM
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
## 2020-03-23 ENCOUNTER — Other Ambulatory Visit: Payer: Self-pay | Admitting: Obstetrics & Gynecology

## 2020-03-23 DIAGNOSIS — Z1231 Encounter for screening mammogram for malignant neoplasm of breast: Secondary | ICD-10-CM

## 2020-03-24 ENCOUNTER — Ambulatory Visit: Payer: BC Managed Care – PPO | Admitting: Obstetrics & Gynecology

## 2020-05-27 ENCOUNTER — Ambulatory Visit
Admission: RE | Admit: 2020-05-27 | Discharge: 2020-05-27 | Disposition: A | Discharge status: Home / Self Care | Payer: BC Managed Care – PPO | Source: Ambulatory Visit | Attending: Obstetrics & Gynecology | Admitting: Obstetrics & Gynecology

## 2020-05-27 ENCOUNTER — Other Ambulatory Visit: Payer: Self-pay

## 2020-05-27 DIAGNOSIS — Z1231 Encounter for screening mammogram for malignant neoplasm of breast: Secondary | ICD-10-CM

## 2020-05-29 ENCOUNTER — Other Ambulatory Visit: Payer: Self-pay | Admitting: Obstetrics & Gynecology

## 2020-05-29 DIAGNOSIS — R928 Other abnormal and inconclusive findings on diagnostic imaging of breast: Secondary | ICD-10-CM

## 2020-06-10 ENCOUNTER — Other Ambulatory Visit: Payer: Self-pay

## 2020-06-10 ENCOUNTER — Ambulatory Visit
Admission: RE | Admit: 2020-06-10 | Discharge: 2020-06-10 | Disposition: A | Discharge status: Home / Self Care | Payer: BC Managed Care – PPO | Source: Ambulatory Visit | Attending: Obstetrics & Gynecology | Admitting: Obstetrics & Gynecology

## 2020-06-10 ENCOUNTER — Other Ambulatory Visit: Payer: Self-pay | Admitting: Obstetrics & Gynecology

## 2020-06-10 DIAGNOSIS — R928 Other abnormal and inconclusive findings on diagnostic imaging of breast: Secondary | ICD-10-CM

## 2020-06-24 ENCOUNTER — Other Ambulatory Visit: Payer: Self-pay

## 2020-06-24 ENCOUNTER — Ambulatory Visit
Admission: RE | Admit: 2020-06-24 | Discharge: 2020-06-24 | Disposition: A | Discharge status: Home / Self Care | Payer: BC Managed Care – PPO | Source: Ambulatory Visit | Attending: Obstetrics & Gynecology | Admitting: Obstetrics & Gynecology

## 2020-06-24 DIAGNOSIS — R928 Other abnormal and inconclusive findings on diagnostic imaging of breast: Secondary | ICD-10-CM

## 2020-11-17 ENCOUNTER — Telehealth: Payer: BC Managed Care – PPO | Admitting: Family Medicine

## 2021-03-26 ENCOUNTER — Ambulatory Visit: Payer: BC Managed Care – PPO

## 2021-04-21 ENCOUNTER — Other Ambulatory Visit: Payer: Self-pay | Admitting: Obstetrics & Gynecology

## 2021-04-21 DIAGNOSIS — Z1231 Encounter for screening mammogram for malignant neoplasm of breast: Secondary | ICD-10-CM

## 2021-06-16 ENCOUNTER — Ambulatory Visit
Admission: RE | Admit: 2021-06-16 | Discharge: 2021-06-16 | Disposition: A | Discharge status: Home / Self Care | Payer: BC Managed Care – PPO | Source: Ambulatory Visit | Attending: Obstetrics & Gynecology | Admitting: Obstetrics & Gynecology

## 2021-06-16 ENCOUNTER — Other Ambulatory Visit: Payer: Self-pay

## 2021-06-16 DIAGNOSIS — Z1231 Encounter for screening mammogram for malignant neoplasm of breast: Secondary | ICD-10-CM

## 2021-07-14 ENCOUNTER — Ambulatory Visit: Payer: BC Managed Care – PPO | Admitting: Family Medicine

## 2021-07-14 ENCOUNTER — Encounter: Payer: Self-pay | Admitting: Family Medicine

## 2021-07-14 ENCOUNTER — Other Ambulatory Visit: Payer: Self-pay

## 2021-07-14 VITALS — BP 116/66 | HR 65 | Temp 97.3°F | Ht 61.75 in | Wt 112.8 lb

## 2021-07-14 DIAGNOSIS — E785 Hyperlipidemia, unspecified: Secondary | ICD-10-CM | POA: Diagnosis not present

## 2021-07-14 DIAGNOSIS — M85852 Other specified disorders of bone density and structure, left thigh: Secondary | ICD-10-CM

## 2021-07-14 DIAGNOSIS — R922 Inconclusive mammogram: Secondary | ICD-10-CM | POA: Diagnosis not present

## 2021-07-14 DIAGNOSIS — Z23 Encounter for immunization: Secondary | ICD-10-CM | POA: Diagnosis not present

## 2021-07-14 DIAGNOSIS — J302 Other seasonal allergic rhinitis: Secondary | ICD-10-CM | POA: Insufficient documentation
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
## 2021-07-16 ENCOUNTER — Ambulatory Visit: Payer: BC Managed Care – PPO | Admitting: Family Medicine

## 2021-07-19 ENCOUNTER — Ambulatory Visit (HOSPITAL_BASED_OUTPATIENT_CLINIC_OR_DEPARTMENT_OTHER): Payer: BC Managed Care – PPO | Admitting: Obstetrics & Gynecology

## 2021-07-29 ENCOUNTER — Ambulatory Visit (HOSPITAL_BASED_OUTPATIENT_CLINIC_OR_DEPARTMENT_OTHER): Payer: BC Managed Care – PPO | Admitting: Obstetrics & Gynecology

## 2021-08-04 ENCOUNTER — Ambulatory Visit (HOSPITAL_BASED_OUTPATIENT_CLINIC_OR_DEPARTMENT_OTHER): Payer: BC Managed Care – PPO | Admitting: Obstetrics & Gynecology

## 2021-08-17 ENCOUNTER — Other Ambulatory Visit: Payer: BC Managed Care – PPO

## 2021-09-28 ENCOUNTER — Ambulatory Visit (HOSPITAL_BASED_OUTPATIENT_CLINIC_OR_DEPARTMENT_OTHER): Payer: BC Managed Care – PPO | Admitting: Obstetrics & Gynecology

## 2021-10-25 ENCOUNTER — Other Ambulatory Visit: Payer: Self-pay

## 2021-10-25 ENCOUNTER — Other Ambulatory Visit (HOSPITAL_COMMUNITY)
Admission: RE | Admit: 2021-10-25 | Discharge: 2021-10-25 | Disposition: A | Discharge status: Home / Self Care | Payer: BC Managed Care – PPO | Source: Ambulatory Visit | Attending: Obstetrics & Gynecology | Admitting: Obstetrics & Gynecology

## 2021-10-25 ENCOUNTER — Encounter (HOSPITAL_BASED_OUTPATIENT_CLINIC_OR_DEPARTMENT_OTHER): Payer: Self-pay | Admitting: Obstetrics & Gynecology

## 2021-10-25 ENCOUNTER — Ambulatory Visit (INDEPENDENT_AMBULATORY_CARE_PROVIDER_SITE_OTHER): Payer: BC Managed Care – PPO | Admitting: Obstetrics & Gynecology

## 2021-10-25 VITALS — BP 124/87 | HR 69 | Ht 61.5 in | Wt 120.0 lb

## 2021-10-25 DIAGNOSIS — Z124 Encounter for screening for malignant neoplasm of cervix: Secondary | ICD-10-CM | POA: Diagnosis present

## 2021-10-25 DIAGNOSIS — Z860100 Personal history of colon polyps, unspecified: Secondary | ICD-10-CM | POA: Insufficient documentation

## 2021-10-25 DIAGNOSIS — Z01419 Encounter for gynecological examination (general) (routine) without abnormal findings: Secondary | ICD-10-CM | POA: Diagnosis not present

## 2021-10-25 DIAGNOSIS — M8580 Other specified disorders of bone density and structure, unspecified site: Secondary | ICD-10-CM

## 2021-10-25 DIAGNOSIS — E785 Hyperlipidemia, unspecified: Secondary | ICD-10-CM | POA: Diagnosis not present

## 2021-10-25 DIAGNOSIS — R748 Abnormal levels of other serum enzymes: Secondary | ICD-10-CM | POA: Insufficient documentation
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
## 2021-11-09 LAB — CYTOLOGY - PAP
Comment: NEGATIVE
Diagnosis: NEGATIVE
High risk HPV: NEGATIVE

## 2022-01-06 ENCOUNTER — Ambulatory Visit: Payer: Self-pay

## 2022-01-06 ENCOUNTER — Other Ambulatory Visit: Payer: Self-pay | Admitting: Family Medicine

## 2022-01-06 DIAGNOSIS — M25552 Pain in left hip: Secondary | ICD-10-CM

## 2022-02-25 ENCOUNTER — Other Ambulatory Visit: Payer: Self-pay | Admitting: Obstetrics & Gynecology

## 2022-02-25 DIAGNOSIS — Z1231 Encounter for screening mammogram for malignant neoplasm of breast: Secondary | ICD-10-CM

## 2022-07-18 ENCOUNTER — Ambulatory Visit (INDEPENDENT_AMBULATORY_CARE_PROVIDER_SITE_OTHER): Payer: BC Managed Care – PPO | Admitting: Family Medicine

## 2022-07-18 ENCOUNTER — Encounter: Payer: Self-pay | Admitting: Family Medicine

## 2022-07-18 VITALS — BP 110/66 | HR 75 | Temp 96.7°F | Ht 61.5 in | Wt 116.2 lb

## 2022-07-18 DIAGNOSIS — J301 Allergic rhinitis due to pollen: Secondary | ICD-10-CM

## 2022-07-18 DIAGNOSIS — M85852 Other specified disorders of bone density and structure, left thigh: Secondary | ICD-10-CM | POA: Diagnosis not present

## 2022-07-18 DIAGNOSIS — E785 Hyperlipidemia, unspecified: Secondary | ICD-10-CM | POA: Diagnosis not present

## 2022-07-18 DIAGNOSIS — Z0000 Encounter for general adult medical examination without abnormal findings: Secondary | ICD-10-CM
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
## 2022-08-11 ENCOUNTER — Ambulatory Visit
Admission: RE | Admit: 2022-08-11 | Discharge: 2022-08-11 | Disposition: A | Discharge status: Home / Self Care | Payer: BC Managed Care – PPO | Source: Ambulatory Visit | Attending: Obstetrics & Gynecology | Admitting: Obstetrics & Gynecology

## 2022-08-11 ENCOUNTER — Other Ambulatory Visit: Payer: Self-pay

## 2022-08-11 ENCOUNTER — Ambulatory Visit: Payer: BC Managed Care – PPO

## 2022-08-11 ENCOUNTER — Other Ambulatory Visit: Payer: BC Managed Care – PPO

## 2022-08-11 DIAGNOSIS — M8580 Other specified disorders of bone density and structure, unspecified site: Secondary | ICD-10-CM

## 2022-08-11 DIAGNOSIS — Z1231 Encounter for screening mammogram for malignant neoplasm of breast: Secondary | ICD-10-CM

## 2022-10-31 ENCOUNTER — Ambulatory Visit (HOSPITAL_BASED_OUTPATIENT_CLINIC_OR_DEPARTMENT_OTHER): Payer: BC Managed Care – PPO | Admitting: Obstetrics & Gynecology

## 2022-11-02 ENCOUNTER — Ambulatory Visit (INDEPENDENT_AMBULATORY_CARE_PROVIDER_SITE_OTHER): Payer: BC Managed Care – PPO | Admitting: Family Medicine

## 2022-11-02 ENCOUNTER — Encounter: Payer: Self-pay | Admitting: Family Medicine

## 2022-11-02 VITALS — BP 100/62 | HR 66 | Temp 97.1°F | Ht 61.5 in | Wt 119.6 lb

## 2022-11-02 DIAGNOSIS — E785 Hyperlipidemia, unspecified: Secondary | ICD-10-CM

## 2022-11-02 DIAGNOSIS — J069 Acute upper respiratory infection, unspecified: Secondary | ICD-10-CM

## 2022-11-02 LAB — LIPID PANEL
Cholesterol: 223 mg/dL — ABNORMAL HIGH (ref 0–200)
HDL: 51.8 mg/dL (ref >39.00)
LDL Cholesterol: 137 mg/dL — ABNORMAL HIGH (ref 0–99)
NonHDL: 171
Total CHOL/HDL Ratio: 4
Triglycerides: 170 mg/dL — ABNORMAL HIGH (ref 0.0–149.0)
VLDL: 34 mg/dL (ref 0.0–40.0)

## 2022-11-02 LAB — POC COVID19 BINAXNOW: SARS Coronavirus 2 Ag: NEGATIVE

## 2022-11-02 LAB — POCT INFLUENZA A/B
Influenza A, POC: NEGATIVE
Influenza B, POC: NEGATIVE

## 2022-11-02 MED ORDER — ALBUTEROL SULFATE HFA 108 (90 BASE) MCG/ACT IN AERS: 1.0000 | INHALATION_SPRAY | Freq: Four times a day (QID) | RESPIRATORY_TRACT | 2 refills | Status: AC | PRN
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
## 2022-11-02 NOTE — Patient Instructions (Signed)

## 2022-11-02 NOTE — Addendum Note (Signed)
Addended by: [name] on: 11/02/2022 02:25 PM   Modules accepted: Orders

## 2023-02-09 ENCOUNTER — Encounter (HOSPITAL_BASED_OUTPATIENT_CLINIC_OR_DEPARTMENT_OTHER): Payer: Self-pay | Admitting: Obstetrics & Gynecology

## 2023-02-09 ENCOUNTER — Ambulatory Visit (INDEPENDENT_AMBULATORY_CARE_PROVIDER_SITE_OTHER): Payer: BC Managed Care – PPO | Admitting: Obstetrics & Gynecology

## 2023-02-09 VITALS — BP 103/66 | HR 66 | Ht 61.5 in | Wt 116.8 lb

## 2023-02-09 DIAGNOSIS — M8580 Other specified disorders of bone density and structure, unspecified site: Secondary | ICD-10-CM

## 2023-02-09 DIAGNOSIS — E785 Hyperlipidemia, unspecified: Secondary | ICD-10-CM | POA: Diagnosis not present

## 2023-02-09 DIAGNOSIS — Z01419 Encounter for gynecological examination (general) (routine) without abnormal findings: Secondary | ICD-10-CM | POA: Diagnosis not present

## 2023-02-09 NOTE — Patient Instructions (Signed)
lentigo
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
## 2023-02-24 ENCOUNTER — Encounter: Payer: Self-pay | Admitting: Nurse Practitioner

## 2023-02-24 ENCOUNTER — Ambulatory Visit: Payer: BC Managed Care – PPO | Admitting: Nurse Practitioner

## 2023-02-24 VITALS — BP 104/70 | HR 81 | Temp 98.5°F | Ht 61.5 in | Wt 117.8 lb

## 2023-02-24 DIAGNOSIS — J22 Unspecified acute lower respiratory infection: Secondary | ICD-10-CM | POA: Diagnosis not present

## 2023-02-24 MED ORDER — PROMETHAZINE-DM 6.25-15 MG/5ML PO SYRP: 5.0000 mL | ORAL_SOLUTION | Freq: Four times a day (QID) | ORAL | 0 refills | Status: DC | PRN

## 2023-02-24 MED ORDER — AZITHROMYCIN 250 MG PO TABS: ORAL_TABLET | ORAL | 0 refills | Status: DC

## 2023-02-24 NOTE — Patient Instructions (Signed)
It was great to see you!  Start azithromycin 2 tablets today, then 1 tablet daily.   Start promethazine dm every 4 hours as needed for cough. This may make you sleepy.  Keep drinking plenty of fluids and getting rest.   Let's follow-up if your symptoms worsen or don't improve.  Take care,  Rodman Pickle, NP

## 2023-02-24 NOTE — Progress Notes (Signed)
Acute Office Visit  Subjective:     Patient ID: Colleen Allen, female    DOB: 1961-06-16, 62 y.o.   MRN: [id number]  Chief Complaint  Patient presents with   Cough    Constant coughing since Sunday, congestion, fatigue    HPI Patient is in today for cough, congestion, and fatigue for 6 days.   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     Objective:    BP 104/70 (BP Location: Left Arm)   Pulse 81   Temp 98.5 F (36.9 C) (Oral)   Ht 5' 1.5" (1.562 m)   Wt 117 lb 12.8 oz (53.4 kg)   LMP 11/21/2012   SpO2 96%   BMI 21.90 kg/m    Physical Exam Vitals and nursing note reviewed.  Constitutional:      General: She is not in acute distress.    Appearance: Normal appearance.  HENT:     Head: Normocephalic.     Right Ear: Tympanic membrane, ear canal and external ear normal.     Left Ear: Tympanic membrane, ear canal and external ear normal.     Mouth/Throat:     Pharynx: Posterior oropharyngeal erythema present. No oropharyngeal exudate.  Eyes:     Conjunctiva/sclera: Conjunctivae normal.  Cardiovascular:     Rate and Rhythm: Normal rate and regular rhythm.     Pulses: Normal pulses.     Heart sounds: Normal heart sounds.  Pulmonary:     Effort: Pulmonary effort is  normal.     Breath sounds: Rhonchi present.  Musculoskeletal:     Cervical back: Normal range of motion and neck supple. No tenderness.  Lymphadenopathy:     Cervical: No cervical adenopathy.  Skin:    General: Skin is warm.  Neurological:     General: No focal deficit present.     Mental Status: She is alert and oriented to person, place, and time.  Psychiatric:        Mood and Affect: Mood normal.        Behavior: Behavior normal.        Thought Content: Thought content normal.        Judgment: Judgment normal.       Assessment & Plan:   Problem List Items Addressed This Visit   None Visit Diagnoses     Lower respiratory infection    -  Primary   Start zpak for possible infection and promethazine dm prn cough. Encourage rest and fluids. Follow-up if not improving.   Relevant Medications   azithromycin (ZITHROMAX) 250 MG tablet       Meds ordered this encounter  Medications   promethazine-dextromethorphan (PROMETHAZINE-DM) 6.25-15 MG/5ML syrup    Sig: Take 5 mLs by mouth 4 (four) times daily as needed for cough.    Dispense:  118 mL    Refill:  0  azithromycin (ZITHROMAX) 250 MG tablet    Sig: Take 2 tablets on day 1, then 1 tablet daily on days 2 through 5    Dispense:  6 tablet    Refill:  0    Return if symptoms worsen or fail to improve.  Gerre Scull, NP

## 2023-03-01 ENCOUNTER — Encounter: Payer: Self-pay | Admitting: Nurse Practitioner

## 2023-03-01 ENCOUNTER — Ambulatory Visit: Payer: BC Managed Care – PPO | Admitting: Nurse Practitioner

## 2023-03-01 VITALS — BP 94/58 | HR 77 | Temp 97.3°F | Ht 61.5 in | Wt 116.0 lb

## 2023-03-01 DIAGNOSIS — J22 Unspecified acute lower respiratory infection: Secondary | ICD-10-CM | POA: Insufficient documentation

## 2023-03-01 MED ORDER — LEVOFLOXACIN 500 MG PO TABS: 500.0000 mg | ORAL_TABLET | Freq: Every day | ORAL | 0 refills | Status: AC

## 2023-03-01 MED ORDER — PREDNISONE 20 MG PO TABS: 40.0000 mg | ORAL_TABLET | Freq: Every day | ORAL | 0 refills | Status: DC

## 2023-03-01 NOTE — Patient Instructions (Signed)
It was great to see you!  Start prednisone 2 tablets daily for 5 days take this with food, in the morning.   Start levaquin 1 tablet daily for 7 days.   Go get an xray of your chest Monday-Friday from 8:30-4:30 and you can walk in.   Let's follow-up if your symptoms worsen or don't improve.   Take care,  Rodman Pickle, NP

## 2023-03-01 NOTE — Progress Notes (Signed)
Acute Office Visit  Subjective:     Patient ID: SARAHMARIE BATCHO, female    DOB: [birth year], 62 y.o.   MRN: [id number]  Chief Complaint  Patient presents with   Cough    Was seen 02/24/23 still having a lot of coughing    HPI Patient is in today for ongoing cough with sputum production, fatigue, and clamminess. She has finished the z pak, however is not feeling any better. She feels run down, is still having frequent coughing spells and is now having mid chest pressure. She states that the pain does not radiate and does not worsen with coughing. She denies fevers, ear pain, and sinus pain. The promethazine is helping, but she can only take it at night because it makes her sleepy.   ROS See pertinent positives and negatives per HPI.     Objective:    BP (!) 94/58 (BP Location: Left Arm)   Pulse 77   Temp (!) 97.3 F (36.3 C)   Ht 5' 1.5" (1.562 m)   Wt 116 lb (52.6 kg)   LMP 11/21/2012   SpO2 97%   BMI 21.56 kg/m    Physical Exam Vitals and nursing note reviewed.  Constitutional:      General: She is not in acute distress.    Appearance: Normal appearance.  HENT:     Head: Normocephalic.  Eyes:     Conjunctiva/sclera: Conjunctivae normal.  Cardiovascular:     Rate and Rhythm: Normal rate and regular rhythm.     Pulses: Normal pulses.     Heart sounds: Normal heart sounds.  Pulmonary:     Effort: Pulmonary effort is normal.     Breath sounds: Rhonchi present.  Musculoskeletal:     Cervical back: Normal range of motion and neck supple. No tenderness.  Lymphadenopathy:     Cervical: No cervical adenopathy.  Skin:    General: Skin is warm.  Neurological:     General: No focal deficit present.     Mental Status: She is alert and oriented to person, place, and time.  Psychiatric:        Mood and Affect: Mood normal.        Behavior: Behavior normal.        Thought Content: Thought content normal.        Judgment: Judgment normal.       Assessment & Plan:    Problem List Items Addressed This Visit       Respiratory   Lower respiratory infection - Primary    She is having ongoing coughing, fatigue, and clamminess. Rhonchii heard on exam which is new from last week. Will get a chest x-ray, start prednisone 40mg  daily x5 days and levaquin 500mg  daily x7 days. Continue promethazine dm as needed for cough. Encourage rest, fluids. Work note given. Declined EKG today. Follow-up if not improving.       Relevant Orders   DG Chest 2 View    Meds ordered this encounter  Medications   predniSONE (DELTASONE) 20 MG tablet    Sig: Take 2 tablets (40 mg total) by mouth daily with breakfast.    Dispense:  10 tablet    Refill:  0   levofloxacin (LEVAQUIN) 500 MG tablet    Sig: Take 1 tablet (500 mg total) by mouth daily for 7 days.    Dispense:  7 tablet    Refill:  0    Return if symptoms worsen or fail to improve.  Leotis Shames  Rueben Bash, NP

## 2023-03-01 NOTE — Assessment & Plan Note (Signed)
She is having ongoing coughing, fatigue, and clamminess. Rhonchii heard on exam which is new from last week. Will get a chest x-ray, start prednisone 40mg  daily x5 days and levaquin 500mg  daily x7 days. Continue promethazine dm as needed for cough. Encourage rest, fluids. Work note given. Declined EKG today. Follow-up if not improving.

## 2023-03-02 ENCOUNTER — Ambulatory Visit (INDEPENDENT_AMBULATORY_CARE_PROVIDER_SITE_OTHER): Payer: BC Managed Care – PPO

## 2023-03-02 DIAGNOSIS — J22 Unspecified acute lower respiratory infection: Secondary | ICD-10-CM

## 2023-03-03 ENCOUNTER — Telehealth: Payer: Self-pay | Admitting: Family Medicine

## 2023-03-03 NOTE — Telephone Encounter (Signed)
Pt is anxious to hear the results of her chest xray. Please call.

## 2023-03-03 NOTE — Telephone Encounter (Signed)
Patient notified that we haven't gotten the report yet.  Will call when we get results. Dm/cma

## 2023-03-07 NOTE — Telephone Encounter (Signed)
Lft VM to rtn call regarding x-ray results. Dm/cma

## 2023-03-14 NOTE — Telephone Encounter (Signed)
See other message on results.  She was advised. Dm/cma

## 2023-07-13 ENCOUNTER — Other Ambulatory Visit: Payer: Self-pay | Admitting: Obstetrics & Gynecology

## 2023-07-13 DIAGNOSIS — Z0000 Encounter for general adult medical examination without abnormal findings: Secondary | ICD-10-CM

## 2023-07-24 ENCOUNTER — Encounter: Payer: Self-pay | Admitting: Family Medicine

## 2023-07-24 ENCOUNTER — Ambulatory Visit (INDEPENDENT_AMBULATORY_CARE_PROVIDER_SITE_OTHER): Payer: BC Managed Care – PPO | Admitting: Family Medicine

## 2023-07-24 VITALS — BP 114/68 | HR 62 | Temp 97.9°F | Ht 61.5 in | Wt 115.8 lb

## 2023-07-24 DIAGNOSIS — Z0000 Encounter for general adult medical examination without abnormal findings: Secondary | ICD-10-CM | POA: Diagnosis not present

## 2023-07-24 DIAGNOSIS — E785 Hyperlipidemia, unspecified: Secondary | ICD-10-CM

## 2023-07-24 NOTE — Patient Instructions (Signed)

## 2023-07-24 NOTE — Assessment & Plan Note (Signed)
AHA/ACC cardiovascular risk score shows a 4.4 % (low) risk of a MI or stroke in the next 10 years. I recommend eating a heart-healthy diet (DASH diet or Mediterranean), getting 150 minutes of moderate-intensity exercise each week, maintaining a normal weight, and avoiding tobacco products. I will have her return for fasting labs in Jan.
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
## 2023-08-14 ENCOUNTER — Ambulatory Visit: Payer: BC Managed Care – PPO

## 2023-09-05 ENCOUNTER — Ambulatory Visit
Admission: RE | Admit: 2023-09-05 | Discharge: 2023-09-05 | Disposition: A | Discharge status: Home / Self Care | Payer: BC Managed Care – PPO | Source: Ambulatory Visit | Attending: Obstetrics & Gynecology | Admitting: Obstetrics & Gynecology

## 2023-09-05 ENCOUNTER — Ambulatory Visit: Payer: BC Managed Care – PPO

## 2023-09-05 DIAGNOSIS — Z0000 Encounter for general adult medical examination without abnormal findings: Secondary | ICD-10-CM

## 2023-12-12 ENCOUNTER — Encounter: Payer: Self-pay | Admitting: Family Medicine

## 2023-12-12 ENCOUNTER — Ambulatory Visit: Admitting: Family Medicine

## 2023-12-12 VITALS — BP 104/62 | HR 62 | Temp 98.6°F | Ht 61.0 in | Wt 116.8 lb

## 2023-12-12 DIAGNOSIS — R6889 Other general symptoms and signs: Secondary | ICD-10-CM

## 2023-12-12 DIAGNOSIS — J4520 Mild intermittent asthma, uncomplicated: Secondary | ICD-10-CM

## 2023-12-12 DIAGNOSIS — Z1152 Encounter for screening for COVID-19: Secondary | ICD-10-CM

## 2023-12-12 DIAGNOSIS — J029 Acute pharyngitis, unspecified: Secondary | ICD-10-CM

## 2023-12-12 DIAGNOSIS — J069 Acute upper respiratory infection, unspecified: Secondary | ICD-10-CM | POA: Diagnosis not present

## 2023-12-12 LAB — POC COVID19 BINAXNOW: SARS Coronavirus 2 Ag: NEGATIVE

## 2023-12-12 LAB — POCT INFLUENZA A/B
Influenza A, POC: NEGATIVE
Influenza B, POC: NEGATIVE

## 2023-12-12 LAB — POCT RAPID STREP A (OFFICE): Rapid Strep A Screen: NEGATIVE

## 2023-12-12 MED ORDER — PREDNISONE 20 MG PO TABS: 20.0000 mg | ORAL_TABLET | Freq: Two times a day (BID) | ORAL | 0 refills | Status: AC

## 2023-12-12 MED ORDER — PROMETHAZINE-DM 6.25-15 MG/5ML PO SYRP: 5.0000 mL | ORAL_SOLUTION | Freq: Four times a day (QID) | ORAL | 0 refills | Status: DC | PRN

## 2023-12-12 MED ORDER — ALBUTEROL SULFATE HFA 108 (90 BASE) MCG/ACT IN AERS: 1.0000 | INHALATION_SPRAY | Freq: Four times a day (QID) | RESPIRATORY_TRACT | 2 refills | Status: AC | PRN

## 2023-12-12 NOTE — Progress Notes (Signed)
 Established Patient Office Visit   Subjective:  Patient ID: Colleen Allen, female    DOB: [birth year]  Age: 63 y.o. MRN: [id number]  Chief Complaint  Patient presents with   Sore Throat    Sore throat, fever, fatigue, chest tightness, slight cough, stomach aches w/o diarrhea x 2 days. Pt is currently taking Tylenol cold and flu.     Sore Throat  Associated symptoms include congestion, coughing and diarrhea. Pertinent negatives include no abdominal pain, headaches, shortness of breath or vomiting.   Encounter Diagnoses  Name Primary?   Sore throat Yes   Encounter for screening for COVID-19    Flu-like symptoms    Viral URI with cough    Mild intermittent reactive airway disease without complication    2-day history of nasal congestion with postnasal drip, sore throat, tightness in the chest with wheezing.  Denies fevers chills, headaches, body aches.  There has been nausea without vomiting and loose stool.  History of reactive airway disease with respiratory tract illnesses.  No asthma history.  She does not smoke   Review of Systems  Constitutional: Negative.  Negative for chills and fever.  HENT:  Positive for congestion and sore throat. Negative for sinus pain.   Eyes:  Negative for blurred vision, discharge and redness.  Respiratory:  Positive for cough and wheezing. Negative for shortness of breath.   Cardiovascular: Negative.   Gastrointestinal:  Positive for diarrhea and nausea. Negative for abdominal pain and vomiting.  Genitourinary: Negative.   Musculoskeletal: Negative.  Negative for joint pain and myalgias.  Skin:  Negative for rash.  Neurological:  Negative for tingling, loss of consciousness, weakness and headaches.  Endo/Heme/Allergies:  Negative for polydipsia.     Current Outpatient Medications:    albuterol (VENTOLIN HFA) 108 (90 Base) MCG/ACT inhaler, Inhale 1-2 puffs into the lungs every 6 (six) hours as needed., Disp: 18 g, Rfl: 2   albuterol (VENTOLIN  HFA) 108 (90 Base) MCG/ACT inhaler, Inhale 1-2 puffs into the lungs every 6 (six) hours as needed for wheezing or shortness of breath., Disp: 8 g, Rfl: 2   calcium carbonate (OSCAL) 1500 (600 Ca) MG TABS tablet, Take 600 mg by mouth daily., Disp: , Rfl:    Multiple Vitamins-Minerals (MULTIVITAMIN PO), Take by mouth daily., Disp: , Rfl:    predniSONE (DELTASONE) 20 MG tablet, Take 1 tablet (20 mg total) by mouth 2 (two) times daily with a meal for 7 days., Disp: 14 tablet, Rfl: 0   promethazine-dextromethorphan (PROMETHAZINE-DM) 6.25-15 MG/5ML syrup, Take 5 mLs by mouth 4 (four) times daily as needed., Disp: 118 mL, Rfl: 0   Objective:     BP 104/62   Pulse 62   Temp 98.6 F (37 C)   Ht 5\' 1"  (1.549 m)   Wt 116 lb 12.8 oz (53 kg)   LMP 11/21/2012   SpO2 98%   BMI 22.07 kg/m    Physical Exam Constitutional:      General: She is not in acute distress.    Appearance: Normal appearance. She is not ill-appearing, toxic-appearing or diaphoretic.  HENT:     Head: Normocephalic and atraumatic.     Right Ear: Tympanic membrane, ear canal and external ear normal.     Left Ear: Tympanic membrane, ear canal and external ear normal.     Mouth/Throat:     Mouth: Mucous membranes are moist.     Pharynx: Oropharynx is clear. No oropharyngeal exudate or posterior oropharyngeal erythema.  Eyes:  General: No scleral icterus.       Right eye: No discharge.        Left eye: No discharge.     Extraocular Movements: Extraocular movements intact.     Conjunctiva/sclera: Conjunctivae normal.     Pupils: Pupils are equal, round, and reactive to light.  Cardiovascular:     Rate and Rhythm: Normal rate and regular rhythm.  Pulmonary:     Effort: Pulmonary effort is normal. No respiratory distress.     Breath sounds: Normal breath sounds. No wheezing, rhonchi or rales.  Abdominal:     General: Bowel sounds are normal.  Musculoskeletal:     Cervical back: No rigidity or tenderness.  Skin:     General: Skin is warm and dry.  Neurological:     Mental Status: She is alert and oriented to person, place, and time.  Psychiatric:        Mood and Affect: Mood normal.        Behavior: Behavior normal.      Results for orders placed or performed in visit on 12/12/23  POC COVID-19 BinaxNow  Result Value Ref Range   SARS Coronavirus 2 Ag Negative Negative  POCT Influenza A/B  Result Value Ref Range   Influenza A, POC Negative Negative   Influenza B, POC Negative Negative  POCT rapid strep A  Result Value Ref Range   Rapid Strep A Screen Negative Negative      The 10-year ASCVD risk score (Arnett DK, et al., 2019) is: 4.1%    Assessment & Plan:   Sore throat -     POCT rapid strep A  Encounter for screening for COVID-19 -     POC COVID-19 BinaxNow  Flu-like symptoms -     POCT Influenza A/B  Viral URI with cough -     Promethazine-DM; Take 5 mLs by mouth 4 (four) times daily as needed.  Dispense: 118 mL; Refill: 0  Mild intermittent reactive airway disease without complication -     Albuterol Sulfate HFA; Inhale 1-2 puffs into the lungs every 6 (six) hours as needed for wheezing or shortness of breath.  Dispense: 8 g; Refill: 2 -     predniSONE; Take 1 tablet (20 mg total) by mouth 2 (two) times daily with a meal for 7 days.  Dispense: 14 tablet; Refill: 0    Return Follow-up with Dr. Veto Kemps if not improving in a week., for Out of work through Friday.Mliss Sax, MD

## 2024-02-29 ENCOUNTER — Encounter (HOSPITAL_BASED_OUTPATIENT_CLINIC_OR_DEPARTMENT_OTHER): Payer: Self-pay | Admitting: Obstetrics & Gynecology

## 2024-02-29 ENCOUNTER — Ambulatory Visit (HOSPITAL_BASED_OUTPATIENT_CLINIC_OR_DEPARTMENT_OTHER): Payer: BC Managed Care – PPO | Admitting: Obstetrics & Gynecology

## 2024-02-29 ENCOUNTER — Other Ambulatory Visit (HOSPITAL_COMMUNITY)
Admission: RE | Admit: 2024-02-29 | Discharge: 2024-02-29 | Disposition: A | Discharge status: Home / Self Care | Source: Ambulatory Visit | Attending: Obstetrics & Gynecology | Admitting: Obstetrics & Gynecology

## 2024-02-29 VITALS — BP 92/68 | HR 67 | Ht 61.0 in | Wt 116.0 lb

## 2024-02-29 DIAGNOSIS — Z124 Encounter for screening for malignant neoplasm of cervix: Secondary | ICD-10-CM | POA: Insufficient documentation

## 2024-02-29 DIAGNOSIS — M85852 Other specified disorders of bone density and structure, left thigh: Secondary | ICD-10-CM

## 2024-02-29 DIAGNOSIS — Z01419 Encounter for gynecological examination (general) (routine) without abnormal findings: Secondary | ICD-10-CM

## 2024-02-29 DIAGNOSIS — Z780 Asymptomatic menopausal state: Secondary | ICD-10-CM

## 2024-02-29 NOTE — Addendum Note (Signed)
 Addended by: Lillian Rein on: 02/29/2024 04:21 PM   Modules accepted: Orders

## 2024-02-29 NOTE — Progress Notes (Signed)
 ANNUAL EXAM Patient name: Colleen Allen MRN [id number]  Date of birth: 26-Dec-1960 Chief Complaint:   AEX  History of Present Illness:   Colleen Allen is a 63 y.o. G2P2 Caucasian female being seen today for a routine annual exam.  Her sister moved to [place] and it's really nice for pt to have family closer.  Her nephew is in the same neighborhood as her sister (his mom).    Denies vaginal bleeding.  About six weeks ago, had single episode of bluish gooey, gel like mucous when she wiped.  It happened just that one time and nothing since.  Not rectal bleeding.    Patient's last menstrual period was 11/21/2012.   Last pap  10/25/2021. Results were: neg with neg HR HPV. H/O abnormal pap: no Last mammogram: 09/05/2023 . Results were: normal. Family h/o breast cancer: no Last colonoscopy: 04/26/2019.  Follow up 7 years.  DEXA:  08/2022.  -1.7       07/24/2023    3:53 PM 02/09/2023    3:20 PM 07/18/2022    2:29 PM 10/25/2021   11:32 AM 07/14/2021   10:34 AM  Depression screen PHQ 2/9  Decreased Interest 0 0 0 0 0  Down, Depressed, Hopeless 0 0 0 0 0  PHQ - 2 Score 0 0 0 0 0  Altered sleeping 0      Tired, decreased energy 0      Change in appetite 0      Feeling bad or failure about yourself  0      Trouble concentrating 0      Moving slowly or fidgety/restless 0      Suicidal thoughts 0      PHQ-9 Score 0      Difficult doing work/chores Not difficult at all            07/24/2023    3:54 PM  GAD 7 : Generalized Anxiety Score  Nervous, Anxious, on Edge 0  Control/stop worrying 0  Worry too much - different things 0  Trouble relaxing 0  Restless 0  Easily annoyed or irritable 0  Afraid - awful might happen 0  Total GAD 7 Score 0  Anxiety Difficulty Not difficult at all     Review of Systems:   Pertinent items are noted in HPI   Pertinent History Reviewed:  Reviewed past medical,surgical, social and family history.  Reviewed problem list, medications and  allergies. Physical Assessment:   Vitals:   02/29/24 1508  BP: 92/68  Pulse: 67  Weight: 116 lb (52.6 kg)  Height: 5\' 1"  (1.549 m)  Body mass index is 21.92 kg/m.        Physical Examination:   General appearance - well appearing, and in no distress  Mental status - alert, oriented to person, place, and time  Psych:  She has a normal mood and affect  Skin - warm and dry, normal color, no suspicious lesions noted  Chest - effort normal, all lung fields clear to auscultation bilaterally  Heart - normal rate and regular rhythm  Neck:  midline trachea, no thyromegaly or nodules  Breasts - breasts appear normal, no suspicious masses, no skin or nipple changes or  axillary nodes  Abdomen - soft, nontender, nondistended, no masses or organomegaly  Pelvic - VULVA: normal appearing vulva with no masses, tenderness or lesions   VAGINA: normal appearing vagina with normal color and discharge, no lesions   CERVIX: normal appearing cervix without discharge or  lesions, no CMT  Thin prep pap is done.    UTERUS: uterus is felt to be normal size, shape, consistency and nontender   ADNEXA: No adnexal masses or tenderness noted.  Rectal - normal rectal, good sphincter tone, no masses felt.   Extremities:  No swelling or varicosities noted  Chaperone present for exam Assessment & Plan:  1. Well woman exam with routine gynecological exam (Primary) - Pap smear updated today - Mammogram 08/2023 - Colonoscopy 2020, follow up 7 years - Bone mineral density 08/2022 - lab work done with PCP, Dr. Therese Flash - vaccines reviewed/updated  2. Postmenopausal  3. Osteopenia of left hip    Meds: No orders of the defined types were placed in this encounter.   Follow-up: Return in about 1 year (around 02/28/2025).  Lillian Rein, MD 02/29/2024 3:57 PM

## 2024-03-07 LAB — CYTOLOGY - PAP
Comment: NEGATIVE
Diagnosis: NEGATIVE
High risk HPV: NEGATIVE

## 2024-03-08 ENCOUNTER — Ambulatory Visit (HOSPITAL_BASED_OUTPATIENT_CLINIC_OR_DEPARTMENT_OTHER): Payer: Self-pay | Admitting: Obstetrics & Gynecology

## 2024-03-11 NOTE — Progress Notes (Signed)
 Called patient. DOB verified. Informed patient of lab results and recommendations. Patient expresses understanding. tbw

## 2024-07-29 ENCOUNTER — Other Ambulatory Visit: Payer: Self-pay | Admitting: Obstetrics & Gynecology

## 2024-07-29 DIAGNOSIS — Z1231 Encounter for screening mammogram for malignant neoplasm of breast: Secondary | ICD-10-CM

## 2024-09-09 ENCOUNTER — Ambulatory Visit
Admission: RE | Admit: 2024-09-09 | Discharge: 2024-09-09 | Disposition: A | Source: Ambulatory Visit | Attending: Obstetrics & Gynecology | Admitting: Obstetrics & Gynecology

## 2024-09-09 DIAGNOSIS — Z1231 Encounter for screening mammogram for malignant neoplasm of breast: Secondary | ICD-10-CM

## 2024-09-16 ENCOUNTER — Other Ambulatory Visit: Payer: Self-pay | Admitting: Obstetrics & Gynecology

## 2024-09-16 DIAGNOSIS — R928 Other abnormal and inconclusive findings on diagnostic imaging of breast: Secondary | ICD-10-CM

## 2024-09-23 ENCOUNTER — Ambulatory Visit (HOSPITAL_BASED_OUTPATIENT_CLINIC_OR_DEPARTMENT_OTHER): Payer: Self-pay | Admitting: Obstetrics & Gynecology

## 2024-10-11 ENCOUNTER — Ambulatory Visit
Admission: RE | Admit: 2024-10-11 | Discharge: 2024-10-11 | Disposition: A | Discharge status: Home / Self Care | Source: Ambulatory Visit | Attending: Obstetrics & Gynecology | Admitting: Obstetrics & Gynecology

## 2024-10-11 DIAGNOSIS — R928 Other abnormal and inconclusive findings on diagnostic imaging of breast: Secondary | ICD-10-CM

## 2025-03-10 ENCOUNTER — Ambulatory Visit (HOSPITAL_BASED_OUTPATIENT_CLINIC_OR_DEPARTMENT_OTHER): Admitting: Obstetrics & Gynecology
